# Patient Record
Sex: Female | Born: 2000 | Race: White | Hispanic: No | Marital: Single | Smoking: Former smoker
Health system: Southern US, Community
[De-identification: ages and names within clinical notes are randomized; demographics above are authoritative.]

## PROBLEM LIST (undated history)

## (undated) ENCOUNTER — Emergency Department (HOSPITAL_COMMUNITY): Payer: Medicaid Other

## (undated) DIAGNOSIS — J302 Other seasonal allergic rhinitis: Secondary | ICD-10-CM

## (undated) DIAGNOSIS — H6690 Otitis media, unspecified, unspecified ear: Secondary | ICD-10-CM

## (undated) DIAGNOSIS — Z789 Other specified health status: Secondary | ICD-10-CM

## (undated) DIAGNOSIS — R519 Headache, unspecified: Secondary | ICD-10-CM

## (undated) DIAGNOSIS — Z8719 Personal history of other diseases of the digestive system: Secondary | ICD-10-CM

## (undated) DIAGNOSIS — O26619 Liver and biliary tract disorders in pregnancy, unspecified trimester: Secondary | ICD-10-CM

## (undated) DIAGNOSIS — F419 Anxiety disorder, unspecified: Secondary | ICD-10-CM

## (undated) DIAGNOSIS — K831 Obstruction of bile duct: Secondary | ICD-10-CM

## (undated) HISTORY — DX: Otitis media, unspecified, unspecified ear: H66.90

## (undated) HISTORY — DX: Personal history of other diseases of the digestive system: Z87.19

## (undated) HISTORY — PX: TONSILLECTOMY: SUR1361

## (undated) HISTORY — DX: Obstruction of bile duct: O26.619

## (undated) HISTORY — DX: Obstruction of bile duct: K83.1

---
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
## 2019-02-08 NOTE — L&D Delivery Note (Signed)
Delivery Note Pt progressed quickly to complete and pushed well.  At 3:37 PM a viable female was delivered via Vaginal, Spontaneous (Presentation: Left Occiput Anterior).  APGAR: 7, 8; weight  .   Placenta status: Spontaneous, Intact.  Cord: 3 vessels with the following complications: None.    Anesthesia: Epidural Episiotomy: Median Lacerations: None Suture Repair: 2.0 vicryl rapide Est. Blood Loss (mL):  210  Mom to postpartum.  Baby to Couplet care / Skin to Skin. Will continue magnesium for at least 24 hrs, recheck labs in am Zenaida Niece 04/26/2019, 4:01 PM

## 2019-04-08 DIAGNOSIS — Z8759 Personal history of other complications of pregnancy, childbirth and the puerperium: Secondary | ICD-10-CM

## 2019-04-08 HISTORY — DX: Personal history of other complications of pregnancy, childbirth and the puerperium: Z87.59

## 2019-04-22 LAB — OB RESULTS CONSOLE GBS: GBS: NEGATIVE

## 2019-04-25 ENCOUNTER — Ambulatory Visit (INDEPENDENT_AMBULATORY_CARE_PROVIDER_SITE_OTHER): Payer: Medicaid Other

## 2019-04-25 ENCOUNTER — Encounter (HOSPITAL_COMMUNITY): Payer: Self-pay | Admitting: Obstetrics and Gynecology

## 2019-04-25 ENCOUNTER — Other Ambulatory Visit: Payer: Self-pay

## 2019-04-25 ENCOUNTER — Inpatient Hospital Stay (HOSPITAL_COMMUNITY)
Admission: AD | Admit: 2019-04-25 | Discharge: 2019-04-28 | DRG: 805 | Disposition: A | Discharge status: Home / Self Care | Payer: Medicaid Other | Attending: Obstetrics and Gynecology | Admitting: Obstetrics and Gynecology

## 2019-04-25 VITALS — BP 147/87 | HR 72

## 2019-04-25 DIAGNOSIS — O1494 Unspecified pre-eclampsia, complicating childbirth: Secondary | ICD-10-CM | POA: Diagnosis present

## 2019-04-25 DIAGNOSIS — Z20822 Contact with and (suspected) exposure to covid-19: Secondary | ICD-10-CM | POA: Diagnosis present

## 2019-04-25 DIAGNOSIS — Z3A36 36 weeks gestation of pregnancy: Secondary | ICD-10-CM

## 2019-04-25 DIAGNOSIS — O133 Gestational [pregnancy-induced] hypertension without significant proteinuria, third trimester: Secondary | ICD-10-CM

## 2019-04-25 DIAGNOSIS — Z6791 Unspecified blood type, Rh negative: Secondary | ICD-10-CM | POA: Diagnosis not present

## 2019-04-25 DIAGNOSIS — R7989 Other specified abnormal findings of blood chemistry: Secondary | ICD-10-CM | POA: Diagnosis present

## 2019-04-25 DIAGNOSIS — O26613 Liver and biliary tract disorders in pregnancy, third trimester: Secondary | ICD-10-CM | POA: Diagnosis present

## 2019-04-25 DIAGNOSIS — K831 Obstruction of bile duct: Secondary | ICD-10-CM | POA: Diagnosis present

## 2019-04-25 DIAGNOSIS — O26893 Other specified pregnancy related conditions, third trimester: Secondary | ICD-10-CM | POA: Diagnosis present

## 2019-04-25 DIAGNOSIS — O2662 Liver and biliary tract disorders in childbirth: Secondary | ICD-10-CM | POA: Diagnosis present

## 2019-04-25 DIAGNOSIS — Z87891 Personal history of nicotine dependence: Secondary | ICD-10-CM | POA: Diagnosis not present

## 2019-04-25 HISTORY — DX: Other specified health status: Z78.9

## 2019-04-25 LAB — COMPREHENSIVE METABOLIC PANEL
ALT: 181 U/L — ABNORMAL HIGH (ref 0–44)
AST: 134 U/L — ABNORMAL HIGH (ref 15–41)
Albumin: 2.9 g/dL — ABNORMAL LOW (ref 3.5–5.0)
Alkaline Phosphatase: 390 U/L — ABNORMAL HIGH (ref 38–126)
Anion gap: 13 (ref 5–15)
BUN: 5 mg/dL — ABNORMAL LOW (ref 6–20)
CO2: 18 mmol/L — ABNORMAL LOW (ref 22–32)
Calcium: 8.9 mg/dL (ref 8.9–10.3)
Chloride: 107 mmol/L (ref 98–111)
Creatinine, Ser: 0.59 mg/dL (ref 0.44–1.00)
GFR calc Af Amer: >60 mL/min (ref >60)
GFR calc non Af Amer: >60 mL/min (ref >60)
Glucose, Bld: 105 mg/dL — ABNORMAL HIGH (ref 70–99)
Potassium: 3.9 mmol/L (ref 3.5–5.1)
Sodium: 138 mmol/L (ref 135–145)
Total Bilirubin: 0.8 mg/dL (ref 0.3–1.2)
Total Protein: 6 g/dL — ABNORMAL LOW (ref 6.5–8.1)

## 2019-04-25 LAB — CBC
HCT: 38.8 % (ref 36.0–46.0)
Hemoglobin: 12.6 g/dL (ref 12.0–15.0)
MCH: 28.1 pg (ref 26.0–34.0)
MCHC: 32.5 g/dL (ref 30.0–36.0)
MCV: 86.6 fL (ref 80.0–100.0)
Platelets: 445 10*3/uL — ABNORMAL HIGH (ref 150–400)
RBC: 4.48 MIL/uL (ref 3.87–5.11)
RDW: 13.5 % (ref 11.5–15.5)
WBC: 14.6 10*3/uL — ABNORMAL HIGH (ref 4.0–10.5)
nRBC: 0 % (ref 0.0–0.2)

## 2019-04-25 MED ORDER — HYDRALAZINE HCL 10 MG PO TABS
10.0000 mg | ORAL_TABLET | Freq: Four times a day (QID) | ORAL | Status: DC
  Filled 2019-04-25 (×2): qty 1

## 2019-04-25 MED ORDER — OXYTOCIN 40 UNITS IN NORMAL SALINE INFUSION - SIMPLE MED
2.5000 [IU]/h | INTRAVENOUS | Status: DC
  Administered 2019-04-26: 2.5 [IU]/h via INTRAVENOUS

## 2019-04-25 MED ORDER — ACETAMINOPHEN 325 MG PO TABS: 650.0000 mg | ORAL_TABLET | ORAL | Status: DC | PRN

## 2019-04-25 MED ORDER — MISOPROSTOL 50MCG HALF TABLET
50.0000 ug | ORAL_TABLET | Freq: Three times a day (TID) | ORAL | Status: DC | PRN
  Administered 2019-04-25: 50 ug via ORAL
  Filled 2019-04-25: qty 1

## 2019-04-25 MED ORDER — ONDANSETRON HCL 4 MG/2ML IJ SOLN: 4.0000 mg | Freq: Four times a day (QID) | INTRAMUSCULAR | Status: DC | PRN

## 2019-04-25 MED ORDER — LABETALOL HCL 5 MG/ML IV SOLN: 20.0000 mg | INTRAVENOUS | Status: DC | PRN

## 2019-04-25 MED ORDER — MAGNESIUM SULFATE 40 GM/1000ML IV SOLN
1.0000 g/h | INTRAVENOUS | Status: AC
  Administered 2019-04-26: 2 g/h via INTRAVENOUS
  Filled 2019-04-25 (×2): qty 1000

## 2019-04-25 MED ORDER — BETAMETHASONE SOD PHOS & ACET 6 (3-3) MG/ML IJ SUSP
12.0000 mg | Freq: Once | INTRAMUSCULAR | Status: AC
  Administered 2019-04-25: 13:00:00 12 mg via INTRAMUSCULAR

## 2019-04-25 MED ORDER — LABETALOL HCL 5 MG/ML IV SOLN: 80.0000 mg | INTRAVENOUS | Status: DC | PRN

## 2019-04-25 MED ORDER — TERBUTALINE SULFATE 1 MG/ML IJ SOLN: 0.2500 mg | Freq: Once | INTRAMUSCULAR | Status: DC | PRN

## 2019-04-25 MED ORDER — LACTATED RINGERS IV SOLN: 500.0000 mL | INTRAVENOUS | Status: DC | PRN

## 2019-04-25 MED ORDER — SOD CITRATE-CITRIC ACID 500-334 MG/5ML PO SOLN: 30.0000 mL | ORAL | Status: DC | PRN

## 2019-04-25 MED ORDER — LIDOCAINE HCL (PF) 1 % IJ SOLN: 30.0000 mL | INTRAMUSCULAR | Status: DC | PRN

## 2019-04-25 MED ORDER — OXYTOCIN BOLUS FROM INFUSION
500.0000 mL | Freq: Once | INTRAVENOUS | Status: AC
  Administered 2019-04-26: 500 mL via INTRAVENOUS

## 2019-04-25 MED ORDER — BETAMETHASONE SOD PHOS & ACET 6 (3-3) MG/ML IJ SUSP
12.0000 mg | Freq: Once | INTRAMUSCULAR | Status: AC
  Administered 2019-04-26: 12 mg via INTRAMUSCULAR
  Filled 2019-04-25: qty 5

## 2019-04-25 MED ORDER — OXYTOCIN 40 UNITS IN NORMAL SALINE INFUSION - SIMPLE MED: 1.0000 m[IU]/min | INTRAVENOUS | Status: DC

## 2019-04-25 MED ORDER — BUTORPHANOL TARTRATE 1 MG/ML IJ SOLN
2.0000 mg | INTRAMUSCULAR | Status: DC | PRN
  Administered 2019-04-26: 12:00:00 2 mg via INTRAVENOUS
  Filled 2019-04-25: qty 2

## 2019-04-25 MED ORDER — LABETALOL HCL 5 MG/ML IV SOLN: 40.0000 mg | INTRAVENOUS | Status: DC | PRN

## 2019-04-25 MED ORDER — LACTATED RINGERS IV SOLN: INTRAVENOUS | Status: DC

## 2019-04-25 MED ORDER — OXYCODONE-ACETAMINOPHEN 5-325 MG PO TABS: 1.0000 | ORAL_TABLET | ORAL | Status: DC | PRN

## 2019-04-25 MED ORDER — MAGNESIUM SULFATE BOLUS VIA INFUSION
4.0000 g | Freq: Once | INTRAVENOUS | Status: AC
  Administered 2019-04-25: 23:00:00 4 g via INTRAVENOUS
  Filled 2019-04-25: qty 1000

## 2019-04-25 MED ORDER — MISOPROSTOL 25 MCG QUARTER TABLET
25.0000 ug | ORAL_TABLET | Freq: Three times a day (TID) | ORAL | Status: DC | PRN
  Administered 2019-04-26: 03:00:00 25 ug via VAGINAL
  Filled 2019-04-25: qty 1

## 2019-04-25 MED ORDER — OXYCODONE-ACETAMINOPHEN 5-325 MG PO TABS: 2.0000 | ORAL_TABLET | ORAL | Status: DC | PRN

## 2019-04-25 NOTE — Progress Notes (Signed)
Chelsea [name] here for Betamethasone  Injection.  Injection administered without complication. Patient will return in 24 hours for next injection.  Ralene Bathe, RN 04/25/2019  12:29 PM

## 2019-04-25 NOTE — H&P (Addendum)
Chelsea [name] is a 19 y.o. female G1P0 at 54+ with cholestasis, PIH - elevated Liver enzymes elevated on lab evaluation.  Bile Acids = 40.  Also Rh negative received Rhogam 1/18.  BMZ given 3/18  - 12:35pm, repeat at 00:45am.  D/W pt elevated Liver Enzymes and need for delivery.  D/W pt r/b/a of IOL and process.  GBBS neg 3/15 AST 40, ALT 98; 3/18 AST 80, ALT 127; platelets 423.  D/W pt IOL - PIH and Magensium for sz prophylaxis  OB History    Gravida  1   Para      Term      Preterm      AB      Living        SAB      TAB      Ectopic      Multiple      Live Births            G1 present No pap No STD  Past Medical History:  Diagnosis Date  . Medical history non-contributory   . Otitis media   . Seasonal allergies   Anxiety   No pertinent surgical history.  Family History: family history includes ADD / ADHD in her sister; Anxiety disorder in her mother and sister; Bipolar disorder in her sister; Cancer in her mother; Depression in her maternal grandfather, maternal grandmother, and sister; Diabetes in her maternal grandmother and mother; Hypertension in her maternal grandfather, maternal grandmother, and mother; Migraines in her maternal grandfather, maternal grandmother, mother, and sister; Seizures in her mother and sister. Social History:  reports that she quit smoking about 7 months ago. Her smoking use included cigarettes. She has quit using smokeless tobacco. She reports that she does not drink alcohol or use drugs.Uses CBD.  Single, engaged  All Latex Meds hydroxyzine, PNV, ursodiol, Rhogam, Effexor     Maternal Diabetes: No Genetic Screening: Declined Maternal Ultrasounds/Referrals: Normal Fetal Ultrasounds or other Referrals:  None Maternal Substance Abuse:  No Significant Maternal Medications:  Meds include: Other: CBD Significant Maternal Lab Results:  Group B Strep negative and Rh negative Other Comments:  cholestasis; HELLP  Review of Systems   Constitutional: Negative.   HENT: Negative.   Eyes: Negative.   Respiratory: Negative.   Cardiovascular: Negative.   Gastrointestinal: Negative.   Genitourinary: Negative.   Musculoskeletal: Negative.   Skin: Negative.   Neurological: Negative.   Psychiatric/Behavioral: Negative.    Maternal Medical History:  Fetal activity: Perceived fetal activity is normal.    Prenatal complications: Pre-eclampsia.   cholestasis  Prenatal Complications - Diabetes: none.      Height 5\' 2"  (1.575 m), weight 60.7 kg. Maternal Exam:  Uterine Assessment: Contraction frequency is irregular.   Abdomen: Patient reports no abdominal tenderness. Fundal height is appropriate for gestation.   Estimated fetal weight is 6#.   Fetal presentation: vertex  Introitus: Normal vulva. Normal vagina.    Physical Exam  Constitutional: She is oriented to person, place, and time. She appears well-developed and well-nourished.  HENT:  Head: Normocephalic and atraumatic.  Cardiovascular: Normal rate and regular rhythm.  Respiratory: Effort normal and breath sounds normal. No respiratory distress. She has no wheezes.  GI: Soft. Bowel sounds are normal. She exhibits no distension. There is no abdominal tenderness.  Genitourinary:    Vulva normal.   Musculoskeletal:        General: Normal range of motion.  Neurological: She is alert and oriented to person, place, and  time.  Skin: Skin is warm and dry.  Psychiatric: She has a normal mood and affect. Her behavior is normal.    Prenatal labs: ABO, Rh:  AB neg Antibody:  neg Rubella:  immune RPR:   NR HBsAg:   neg HIV:   neg GBS:   neg  Hgb 15.1/Plt 289/Ur Cx neg/GC neg/Chl neg/ Varicella immune/Hgb electro WNL/glucola 137 - nl 3hr GTT/ Nl anat, female Nl NT  PIH - elevated LFTs  Assessment/Plan: 18yo G1P0 at 36+ with cholestasis and ?HELLP syndrome Second BMZ after MN IOL w cytotec then AROM, pitocin Reevaluate labs  Magnesium for sz  prophylaxis NICU aware  Chelsea [name] 04/25/2019, 10:28 PM

## 2019-04-25 NOTE — Progress Notes (Signed)
Agree with A & P. 

## 2019-04-26 ENCOUNTER — Other Ambulatory Visit: Payer: Self-pay

## 2019-04-26 ENCOUNTER — Ambulatory Visit: Payer: Medicaid Other

## 2019-04-26 ENCOUNTER — Inpatient Hospital Stay (HOSPITAL_COMMUNITY): Payer: Medicaid Other | Admitting: Anesthesiology

## 2019-04-26 ENCOUNTER — Encounter (HOSPITAL_COMMUNITY): Payer: Self-pay | Admitting: Obstetrics and Gynecology

## 2019-04-26 DIAGNOSIS — O26643 Intrahepatic cholestasis of pregnancy, third trimester: Secondary | ICD-10-CM | POA: Diagnosis present

## 2019-04-26 DIAGNOSIS — O26613 Liver and biliary tract disorders in pregnancy, third trimester: Secondary | ICD-10-CM | POA: Diagnosis present

## 2019-04-26 DIAGNOSIS — K831 Obstruction of bile duct: Secondary | ICD-10-CM | POA: Diagnosis present

## 2019-04-26 LAB — COMPREHENSIVE METABOLIC PANEL
ALT: 252 U/L — ABNORMAL HIGH (ref 0–44)
AST: 194 U/L — ABNORMAL HIGH (ref 15–41)
Albumin: 2.9 g/dL — ABNORMAL LOW (ref 3.5–5.0)
Alkaline Phosphatase: 380 U/L — ABNORMAL HIGH (ref 38–126)
Anion gap: 14 (ref 5–15)
BUN: <5 mg/dL — ABNORMAL LOW (ref 6–20)
CO2: 16 mmol/L — ABNORMAL LOW (ref 22–32)
Calcium: 7.6 mg/dL — ABNORMAL LOW (ref 8.9–10.3)
Chloride: 105 mmol/L (ref 98–111)
Creatinine, Ser: 0.6 mg/dL (ref 0.44–1.00)
GFR calc Af Amer: >60 mL/min (ref >60)
GFR calc non Af Amer: >60 mL/min (ref >60)
Glucose, Bld: 118 mg/dL — ABNORMAL HIGH (ref 70–99)
Potassium: 4 mmol/L (ref 3.5–5.1)
Sodium: 135 mmol/L (ref 135–145)
Total Bilirubin: 0.8 mg/dL (ref 0.3–1.2)
Total Protein: 6.2 g/dL — ABNORMAL LOW (ref 6.5–8.1)

## 2019-04-26 LAB — CBC
HCT: 39.4 % (ref 36.0–46.0)
HCT: 35.6 % — ABNORMAL LOW (ref 36.0–46.0)
Hemoglobin: 12.8 g/dL (ref 12.0–15.0)
Hemoglobin: 11.6 g/dL — ABNORMAL LOW (ref 12.0–15.0)
MCH: 28.6 pg (ref 26.0–34.0)
MCH: 28.5 pg (ref 26.0–34.0)
MCHC: 32.5 g/dL (ref 30.0–36.0)
MCHC: 32.6 g/dL (ref 30.0–36.0)
MCV: 88.1 fL (ref 80.0–100.0)
MCV: 87.5 fL (ref 80.0–100.0)
Platelets: 454 10*3/uL — ABNORMAL HIGH (ref 150–400)
Platelets: 417 10*3/uL — ABNORMAL HIGH (ref 150–400)
RBC: 4.47 MIL/uL (ref 3.87–5.11)
RBC: 4.07 MIL/uL (ref 3.87–5.11)
RDW: 13.7 % (ref 11.5–15.5)
RDW: 13.6 % (ref 11.5–15.5)
WBC: 15.2 10*3/uL — ABNORMAL HIGH (ref 4.0–10.5)
WBC: 18.7 10*3/uL — ABNORMAL HIGH (ref 4.0–10.5)
nRBC: 0 % (ref 0.0–0.2)
nRBC: 0 % (ref 0.0–0.2)

## 2019-04-26 LAB — RPR: RPR Ser Ql: NONREACTIVE

## 2019-04-26 LAB — TYPE AND SCREEN
ABO/RH(D): AB NEG
Antibody Screen: POSITIVE

## 2019-04-26 LAB — PROTEIN / CREATININE RATIO, URINE
Creatinine, Urine: 78.1 mg/dL
Protein Creatinine Ratio: 0.35 mg/mg{Cre} — ABNORMAL HIGH (ref 0.00–0.15)
Total Protein, Urine: 27 mg/dL

## 2019-04-26 LAB — SARS CORONAVIRUS 2 (TAT 6-24 HRS): SARS Coronavirus 2: NEGATIVE

## 2019-04-26 MED ORDER — EPHEDRINE 5 MG/ML INJ: 10.0000 mg | INTRAVENOUS | Status: DC | PRN

## 2019-04-26 MED ORDER — DIPHENHYDRAMINE HCL 50 MG/ML IJ SOLN: 12.5000 mg | INTRAMUSCULAR | Status: DC | PRN

## 2019-04-26 MED ORDER — PHENYLEPHRINE 40 MCG/ML (10ML) SYRINGE FOR IV PUSH (FOR BLOOD PRESSURE SUPPORT): 80.0000 ug | PREFILLED_SYRINGE | INTRAVENOUS | Status: DC | PRN

## 2019-04-26 MED ORDER — LACTATED RINGERS IV SOLN: INTRAVENOUS | Status: DC

## 2019-04-26 MED ORDER — FENTANYL-BUPIVACAINE-NACL 0.5-0.125-0.9 MG/250ML-% EP SOLN
12.0000 mL/h | EPIDURAL | Status: DC | PRN
  Filled 2019-04-26: qty 250

## 2019-04-26 MED ORDER — ONDANSETRON HCL 4 MG PO TABS: 4.0000 mg | ORAL_TABLET | ORAL | Status: DC | PRN

## 2019-04-26 MED ORDER — BENZOCAINE-MENTHOL 20-0.5 % EX AERO
1.0000 "application " | INHALATION_SPRAY | CUTANEOUS | Status: DC | PRN
  Administered 2019-04-26: 1 via TOPICAL
  Filled 2019-04-26: qty 56

## 2019-04-26 MED ORDER — METHYLERGONOVINE MALEATE 0.2 MG PO TABS: 0.2000 mg | ORAL_TABLET | ORAL | Status: DC | PRN

## 2019-04-26 MED ORDER — ONDANSETRON HCL 4 MG/2ML IJ SOLN
4.0000 mg | INTRAMUSCULAR | Status: DC | PRN
  Administered 2019-04-26 – 2019-04-27 (×2): 4 mg via INTRAVENOUS
  Filled 2019-04-26 (×2): qty 2

## 2019-04-26 MED ORDER — LACTATED RINGERS IV SOLN
500.0000 mL | Freq: Once | INTRAVENOUS | Status: AC
  Administered 2019-04-26: 500 mL via INTRAVENOUS

## 2019-04-26 MED ORDER — SIMETHICONE 80 MG PO CHEW: 80.0000 mg | CHEWABLE_TABLET | ORAL | Status: DC | PRN

## 2019-04-26 MED ORDER — SODIUM CHLORIDE (PF) 0.9 % IJ SOLN
INTRAMUSCULAR | Status: DC | PRN
  Administered 2019-04-26: 12 mL/h via EPIDURAL

## 2019-04-26 MED ORDER — LIDOCAINE HCL (PF) 1 % IJ SOLN
INTRAMUSCULAR | Status: DC | PRN
  Administered 2019-04-26: 5 mL via EPIDURAL
  Administered 2019-04-26: 3 mL via EPIDURAL
  Administered 2019-04-26: 2 mL via EPIDURAL

## 2019-04-26 MED ORDER — SENNOSIDES-DOCUSATE SODIUM 8.6-50 MG PO TABS
2.0000 | ORAL_TABLET | ORAL | Status: DC
  Administered 2019-04-26 – 2019-04-27 (×2): 2 via ORAL
  Filled 2019-04-26 (×2): qty 2

## 2019-04-26 MED ORDER — WITCH HAZEL-GLYCERIN EX PADS: 1.0000 "application " | MEDICATED_PAD | CUTANEOUS | Status: DC | PRN

## 2019-04-26 MED ORDER — OXYTOCIN 40 UNITS IN NORMAL SALINE INFUSION - SIMPLE MED
1.0000 m[IU]/min | INTRAVENOUS | Status: DC
  Administered 2019-04-26: 4 m[IU]/min via INTRAVENOUS
  Administered 2019-04-26: 2 m[IU]/min via INTRAVENOUS
  Filled 2019-04-26: qty 1000

## 2019-04-26 MED ORDER — ZOLPIDEM TARTRATE 5 MG PO TABS: 5.0000 mg | ORAL_TABLET | Freq: Every evening | ORAL | Status: DC | PRN

## 2019-04-26 MED ORDER — PRENATAL MULTIVITAMIN CH
1.0000 | ORAL_TABLET | Freq: Every day | ORAL | Status: DC
  Administered 2019-04-27 – 2019-04-28 (×2): 1 via ORAL
  Filled 2019-04-26 (×2): qty 1

## 2019-04-26 MED ORDER — MAGNESIUM HYDROXIDE 400 MG/5ML PO SUSP: 30.0000 mL | ORAL | Status: DC | PRN

## 2019-04-26 MED ORDER — ACETAMINOPHEN 325 MG PO TABS
650.0000 mg | ORAL_TABLET | ORAL | Status: DC | PRN
  Filled 2019-04-26: qty 2

## 2019-04-26 MED ORDER — MEASLES, MUMPS & RUBELLA VAC IJ SOLR: 0.5000 mL | Freq: Once | INTRAMUSCULAR | Status: DC

## 2019-04-26 MED ORDER — OXYCODONE HCL 5 MG PO TABS: 10.0000 mg | ORAL_TABLET | ORAL | Status: DC | PRN

## 2019-04-26 MED ORDER — DIPHENHYDRAMINE HCL 25 MG PO CAPS: 25.0000 mg | ORAL_CAPSULE | Freq: Four times a day (QID) | ORAL | Status: DC | PRN

## 2019-04-26 MED ORDER — COCONUT OIL OIL
1.0000 "application " | TOPICAL_OIL | Status: DC | PRN
  Administered 2019-04-26: 1 via TOPICAL

## 2019-04-26 MED ORDER — IBUPROFEN 600 MG PO TABS
600.0000 mg | ORAL_TABLET | Freq: Four times a day (QID) | ORAL | Status: DC
  Administered 2019-04-26 – 2019-04-28 (×8): 600 mg via ORAL
  Filled 2019-04-26 (×8): qty 1

## 2019-04-26 MED ORDER — TETANUS-DIPHTH-ACELL PERTUSSIS 5-2.5-18.5 LF-MCG/0.5 IM SUSP: 0.5000 mL | Freq: Once | INTRAMUSCULAR | Status: DC

## 2019-04-26 MED ORDER — METHYLERGONOVINE MALEATE 0.2 MG/ML IJ SOLN: 0.2000 mg | INTRAMUSCULAR | Status: DC | PRN

## 2019-04-26 MED ORDER — OXYCODONE HCL 5 MG PO TABS
5.0000 mg | ORAL_TABLET | ORAL | Status: DC | PRN
  Administered 2019-04-26 – 2019-04-27 (×2): 5 mg via ORAL
  Filled 2019-04-26 (×2): qty 1

## 2019-04-26 MED ORDER — DIBUCAINE (PERIANAL) 1 % EX OINT: 1.0000 "application " | TOPICAL_OINTMENT | CUTANEOUS | Status: DC | PRN

## 2019-04-26 NOTE — Anesthesia Procedure Notes (Signed)
Epidural Patient location during procedure: OB Start time: 04/26/2019 12:14 PM End time: 04/26/2019 12:22 PM  Staffing Anesthesiologist: Cecile Hearing, MD Performed: anesthesiologist   Preanesthetic Checklist Completed: patient identified, IV checked, risks and benefits discussed, monitors and equipment checked, pre-op evaluation and timeout performed  Epidural Patient position: sitting Prep: DuraPrep Patient monitoring: blood pressure and continuous pulse ox Approach: midline Location: L3-L4 Injection technique: LOR air  Needle:  Needle type: Tuohy  Needle gauge: 17 G Needle length: 9 cm Needle insertion depth: 5 cm Catheter size: 19 Gauge Catheter at skin depth: 10 cm Test dose: negative and Other (1% Lidocaine)  Additional Notes Patient identified.  Risk benefits discussed including failed block, incomplete pain control, headache, nerve damage, paralysis, blood pressure changes, nausea, vomiting, reactions to medication both toxic or allergic, and postpartum back pain.  Patient expressed understanding and wished to proceed.  All questions were answered.  Sterile technique used throughout procedure and epidural site dressed with sterile barrier dressing. No paresthesia or other complications noted. The patient did not experience any signs of intravascular injection such as tinnitus or metallic taste in mouth nor signs of intrathecal spread such as rapid motor block. Please see nursing notes for vital signs. Reason for block:procedure for pain

## 2019-04-26 NOTE — Anesthesia Preprocedure Evaluation (Signed)
Anesthesia Evaluation  Patient identified by MRN, date of birth, ID band Patient awake    Reviewed: Allergy & Precautions, NPO status , Patient's Chart, lab work & pertinent test results  Airway Mallampati: II  TM Distance: >3 FB Neck ROM: Full    Dental  (+) Teeth Intact, Dental Advisory Given   Pulmonary former smoker,    Pulmonary exam normal breath sounds clear to auscultation       Cardiovascular negative cardio ROS Normal cardiovascular exam Rhythm:Regular Rate:Normal     Neuro/Psych  Headaches, PSYCHIATRIC DISORDERS Anxiety    GI/Hepatic negative GI ROS, Elevated LFTs   Endo/Other  negative endocrine ROS  Renal/GU negative Renal ROS     Musculoskeletal negative musculoskeletal ROS (+)   Abdominal   Peds  Hematology negative hematology ROS (+) Plt 454k    Anesthesia Other Findings Day of surgery medications reviewed with the patient.  Reproductive/Obstetrics (+) Pregnancy Pre-eclampsia                              Anesthesia Physical Anesthesia Plan  ASA: III  Anesthesia Plan: Epidural   Post-op Pain Management:    Induction:   PONV Risk Score and Plan: 2 and Treatment may vary due to age or medical condition  Airway Management Planned: Natural Airway  Additional Equipment:   Intra-op Plan:   Post-operative Plan:   Informed Consent: I have reviewed the patients History and Physical, chart, labs and discussed the procedure including the risks, benefits and alternatives for the proposed anesthesia with the patient or authorized representative who has indicated his/her understanding and acceptance.     Dental advisory given  Plan Discussed with:   Anesthesia Plan Comments: (Patient identified. Risks/Benefits/Options discussed with patient including but not limited to bleeding, infection, nerve damage, paralysis, failed block, incomplete pain control, headache,  blood pressure changes, nausea, vomiting, reactions to medication both or allergic, itching and postpartum back pain. Confirmed with bedside nurse the patient's most recent platelet count. Confirmed with patient that they are not currently taking any anticoagulation, have any bleeding history or any family history of bleeding disorders. Patient expressed understanding and wished to proceed. All questions were answered. )        Anesthesia Quick Evaluation

## 2019-04-26 NOTE — Progress Notes (Signed)
Comfortable with epidural Afeb, VSS, BP almost all normal but had one 148/91 FHT-120-130, mod variability, some late decels, + scalp stim, ctx q 2-4 min VE-7-8/90/0, AROM no fluid  LFTs up a bit more  Will monitor progress and FHT closely

## 2019-04-26 NOTE — Progress Notes (Signed)
Feeling some ctx Afeb, VSS, BP normal FHT- 120-130, mod variability, + accels, some ? Early, variable and ? Late decels, Cat II, irreg ctx VE-1/50/-2, vtx  IUP at 36 + weeks with cholestasis, elevated LFTs, UPC 0.35.  Has had a couple BPs 140/100 on admission, but BP currently normal.  On magnesium.  Has received BMZ x 2, has received cytotec x 2.  Will start pitocin, monitor progress and FHT, recheck CMP

## 2019-04-27 ENCOUNTER — Encounter (HOSPITAL_COMMUNITY): Payer: Self-pay | Admitting: Obstetrics and Gynecology

## 2019-04-27 LAB — COMPREHENSIVE METABOLIC PANEL
ALT: 258 U/L — ABNORMAL HIGH (ref 0–44)
AST: 162 U/L — ABNORMAL HIGH (ref 15–41)
Albumin: 2.3 g/dL — ABNORMAL LOW (ref 3.5–5.0)
Alkaline Phosphatase: 311 U/L — ABNORMAL HIGH (ref 38–126)
Anion gap: 11 (ref 5–15)
BUN: <5 mg/dL — ABNORMAL LOW (ref 6–20)
CO2: 24 mmol/L (ref 22–32)
Calcium: 6.7 mg/dL — ABNORMAL LOW (ref 8.9–10.3)
Chloride: 102 mmol/L (ref 98–111)
Creatinine, Ser: 0.68 mg/dL (ref 0.44–1.00)
GFR calc Af Amer: >60 mL/min (ref >60)
GFR calc non Af Amer: >60 mL/min (ref >60)
Glucose, Bld: 112 mg/dL — ABNORMAL HIGH (ref 70–99)
Potassium: 3.9 mmol/L (ref 3.5–5.1)
Sodium: 137 mmol/L (ref 135–145)
Total Bilirubin: 0.5 mg/dL (ref 0.3–1.2)
Total Protein: 5 g/dL — ABNORMAL LOW (ref 6.5–8.1)

## 2019-04-27 LAB — CBC
HCT: 34.9 % — ABNORMAL LOW (ref 36.0–46.0)
Hemoglobin: 11.3 g/dL — ABNORMAL LOW (ref 12.0–15.0)
MCH: 28.7 pg (ref 26.0–34.0)
MCHC: 32.4 g/dL (ref 30.0–36.0)
MCV: 88.6 fL (ref 80.0–100.0)
Platelets: 436 10*3/uL — ABNORMAL HIGH (ref 150–400)
RBC: 3.94 MIL/uL (ref 3.87–5.11)
RDW: 13.8 % (ref 11.5–15.5)
WBC: 16.8 10*3/uL — ABNORMAL HIGH (ref 4.0–10.5)
nRBC: 0 % (ref 0.0–0.2)

## 2019-04-27 MED ORDER — RHO D IMMUNE GLOBULIN 1500 UNIT/2ML IJ SOSY
300.0000 ug | PREFILLED_SYRINGE | Freq: Once | INTRAMUSCULAR | Status: AC
  Administered 2019-04-27: 300 ug via INTRAVENOUS
  Filled 2019-04-27: qty 2

## 2019-04-27 NOTE — Plan of Care (Signed)
  Problem: Education: Goal: Knowledge of condition will improve Outcome: Progressing   Problem: Education: Goal: Knowledge of disease or condition will improve Outcome: Progressing

## 2019-04-27 NOTE — Anesthesia Postprocedure Evaluation (Signed)
Anesthesia Post Note  Patient: Chelsea [name]  Procedure(s) Performed: AN AD HOC LABOR EPIDURAL     Patient location during evaluation: Mother Baby Anesthesia Type: Epidural Level of consciousness: awake and alert, oriented and patient cooperative Pain management: pain level controlled Vital Signs Assessment: post-procedure vital signs reviewed and stable Respiratory status: spontaneous breathing Cardiovascular status: stable Postop Assessment: no headache, epidural receding, patient able to bend at knees and no signs of nausea or vomiting Anesthetic complications: no Comments: Pt. States she is walking.  Pain score 4 and patient states pain is manageable.     Last Vitals:  Vitals:   04/27/19 0000 04/27/19 0545  BP:  (!) 128/95  Pulse:  82  Resp: 18 18  Temp:  36.5 C  SpO2:  100%    Last Pain:  Vitals:   04/27/19 0600  TempSrc:   PainSc: 3    Pain Goal: Patients Stated Pain Goal: 3 (04/27/19 0600)                 Merrilyn Puma

## 2019-04-27 NOTE — Lactation Note (Signed)
This note was copied from a Chelsea's chart. Lactation Consultation Note  Patient Name: Chelsea [name] Today's Date: 04/27/2019  Chelsea [name] now about 54 hours old and mom reports she has attempted today but not latched.  Mom intitiated pumping 1 time with DEBP.  Mom with history of anxiety and depression.   Mom has small, short shaft nipples that evert well with stimulation.  Mom reports she wants to breastfeed.  Urged importance of massage/hand expression/pumping with 36 week infant.Mom reports infant recently fed formula.  Discussed use of donor milk now that mom has initiated pumping.  Parents report they will think on it. Urged parents to offer the breast based on cues and 8 times day or more if no cues.  Parents received LPTi infant green sheet.  Reviewed supplementation amounts.  Parents request come back to the next feeding.    Maternal Data    Feeding Feeding Type: Bottle Fed - Formula Nipple Type: Slow - flow  LATCH Score                   Interventions    Lactation Tools Discussed/Used     Consult Status      Chelsea [name] Michaelle Copas 04/27/2019, 6:53 PM

## 2019-04-27 NOTE — Lactation Note (Signed)
This note was copied from a baby's chart. Lactation Consultation Note  Patient Name: [name] [id number] Date: 04/27/2019   Parents request to assist with feeding at 3 pm .  Discussed with mom possibilty of using nipple shield.  Mom agrees. Show mom how to hand express and get milk started before applying the shield.  Mom able to demo application of nipple shield back. Used 63mm nipple shield with mom. In cross cradle hold on left breast Infant opened and latched well on left breast and fair to good breastfeeding observed for 20 minutes. Infant let go and appeared satiated and breastmilk in nipple shield.  Mom moved her skin to skin and she started rooting.  Parents able to demo applying shield on right breast and latching her in cross cradle hold.  Did not have the dome all the way in mouth.  Reviewed importance of making sure cheeks and chin touch the breast and all the way on the dome. Left mom and baby breastfeeding with dad observing.  Urged to hand express and pump past breastfeedings and feed back all expressed mothers milk and formula as needed to make recommended amount and more if infant wants more. Urged to call lactation as needed.   Maternal Data    Feeding Feeding Type: Bottle Fed - Formula Nipple Type: Slow - flow  LATCH Score                   Interventions    Lactation Tools Discussed/Used     Consult Status      Shimika Ames S Sophie Quiles 04/27/2019, 7:00 PM

## 2019-04-27 NOTE — Progress Notes (Signed)
PPD #1, cholestasis, elevated LFTs, ? Preeclampsia Not feeling very well, nauseated, feels a bit weak Afeb, VSS, BP normal Fundus firm DTR 2/4 Labs essentially unchanged  Will continue current care.  I think she is feeling bad being on magnesium, but I do not think she is magnesium toxic as has normal DTR.  However, she is a small person, will reduce magnesium to 1 gm/hr and stop at 1600.  Will recheck CMP tomorrow

## 2019-04-27 NOTE — Lactation Note (Signed)
This note was copied from a baby's chart. Lactation Consultation Note Baby 10 hrs old.  Mom states baby isn't interested in BF yet. Mom has only been formula/bottle feeding baby. Mom states she will try to BF tomorrow/this morning. Asked mom if she has pumped mom stated no, she will wait until tomorrow/in the morning. DEBP at bedside set up. Mom stated she had given the baby a pacifier. Discouraged for 2 weeks. Explained why. Mom stated in the morning I'll take her pacifier in the morning. LC isn't sure of mom's commitment of BF. LPI information sheet given and reviewed. Explained if mom isn't going to BF then she needs to give baby more than the supplemental; amount. If mom decides not to BF then she will be given information on formula feeding amounts. Mom demonstrated hand expression w/colostrum noted. Mom happily surprised. Mom has small nipples, semi flat very short shaft. 21 flanges given for pump. LPI behavior, STS, importance of I&O, breast massage, milk storage, supply and demand discussed. Encouraged mom to call for assistance or questions. lactation brochure given.  Patient Name: Chelsea [name] [id number] Date: 04/27/2019 Reason for consult: Initial assessment;Primapara;Infant < 6lbs;Late-preterm 34-36.6wks   Maternal Data Has patient been taught Hand Expression?: Yes Does the patient have breastfeeding experience prior to this delivery?: No  Feeding Feeding Type: Bottle Fed - Formula  LATCH Score       Type of Nipple: Everted at rest and after stimulation(semi flat, small)  Comfort (Breast/Nipple): Soft / non-tender        Interventions Interventions: Breast feeding basics reviewed;Breast massage;Hand express;Breast compression  Lactation Tools Discussed/Used Tools: Coconut oil;Pump;Flanges Flange Size: 21 Breast pump type: Double-Electric Breast Pump   Consult Status Consult Status: Follow-up Date: 04/27/19 Follow-up type: In-patient    Dracen Reigle,  Diamond Nickel 04/27/2019, 2:22 AM

## 2019-04-28 LAB — COMPREHENSIVE METABOLIC PANEL
ALT: 164 U/L — ABNORMAL HIGH (ref 0–44)
AST: 55 U/L — ABNORMAL HIGH (ref 15–41)
Albumin: 2.1 g/dL — ABNORMAL LOW (ref 3.5–5.0)
Alkaline Phosphatase: 240 U/L — ABNORMAL HIGH (ref 38–126)
Anion gap: 7 (ref 5–15)
BUN: <5 mg/dL — ABNORMAL LOW (ref 6–20)
CO2: 25 mmol/L (ref 22–32)
Calcium: 8.1 mg/dL — ABNORMAL LOW (ref 8.9–10.3)
Chloride: 107 mmol/L (ref 98–111)
Creatinine, Ser: 0.51 mg/dL (ref 0.44–1.00)
GFR calc Af Amer: >60 mL/min (ref >60)
GFR calc non Af Amer: >60 mL/min (ref >60)
Glucose, Bld: 89 mg/dL (ref 70–99)
Potassium: 3.6 mmol/L (ref 3.5–5.1)
Sodium: 139 mmol/L (ref 135–145)
Total Bilirubin: 0.3 mg/dL (ref 0.3–1.2)
Total Protein: 4.7 g/dL — ABNORMAL LOW (ref 6.5–8.1)

## 2019-04-28 LAB — RH IG WORKUP (INCLUDES ABO/RH)
ABO/RH(D): AB NEG
Fetal Screen: NEGATIVE
Gestational Age(Wks): 36
Unit division: 0

## 2019-04-28 MED ORDER — IBUPROFEN 600 MG PO TABS: 600.0000 mg | ORAL_TABLET | Freq: Four times a day (QID) | ORAL | 0 refills | Status: DC

## 2019-04-28 NOTE — Discharge Instructions (Signed)
As per discharge pamphlet °

## 2019-04-28 NOTE — Discharge Summary (Signed)
OB Discharge Summary     Patient Name: Chelsea [name] DOB: Jan 13, 2001 MRN: [id number]  Date of admission: 04/25/2019 Delivering MD: Chelsea [name], Chelsea [name]   Date of discharge: 04/28/2019  Admitting diagnosis: Indication for care in labor or delivery [O75.9] Intrauterine pregnancy: [redacted]w[redacted]d     Secondary diagnosis:  Active Problems:   Indication for care in labor or delivery   SVD (spontaneous vaginal delivery)   Cholestasis during pregnancy in third trimester      Discharge diagnosis: Preterm Pregnancy Delivered and cholestasis, possible preeclampsia                                  Hospital course:  Induction of Labor With Vaginal Delivery   19 y.o. yo G1P0101 at [redacted]w[redacted]d was admitted to the hospital 04/25/2019 for induction of labor.  Indication for induction: Cholestasis of pregnancy and possible preeclampsia.  Normal BP, but elevated UPC and LFTs.  She was put on magnesium during labor for possible preeclampsia.  Patient had an uncomplicated labor course as follows: Membrane Rupture Time/Date: 2:44 PM ,04/26/2019   Intrapartum Procedures: Episiotomy: Median [2]                                         Lacerations:  None [1]  Patient had delivery of a Viable infant.  Information for the patient's newborn:  Chelsea [name], Service Girl Chelsea [name] [[id number]]  Delivery Method: Vag-Spont    04/26/2019  Details of delivery can be found in separate delivery note.  Patient had a routine postpartum course. Magnesium was continued for 24 hours, BP remained normal.  LFTs stable on PPD #1, decreasing on PPD #2. Patient is discharged home 04/28/19.  Physical exam  Vitals:   04/27/19 1943 04/27/19 2344 04/28/19 0404 04/28/19 0726  BP: 120/70 116/70 101/70 127/66  Pulse: 69 64 (!) 48 (!) 43  Resp: 16 17 16 17   Temp: 99.1 F (37.3 C) 98.6 F (37 C) 98.2 F (36.8 C) 98.2 F (36.8 C)  TempSrc: Oral Oral Oral Oral  SpO2: 98% 97% 100%   Weight:      Height:       General: alert Lochia: appropriate Uterine Fundus:  firm  Labs: Lab Results  Component Value Date   WBC 16.8 (H) 04/27/2019   HGB 11.3 (L) 04/27/2019   HCT 34.9 (L) 04/27/2019   MCV 88.6 04/27/2019   PLT 436 (H) 04/27/2019   CMP Latest Ref Rng & Units 04/28/2019  Glucose 70 - 99 mg/dL 89  BUN 6 - 20 mg/dL 04/30/2019)  Creatinine <5(K - 1.00 mg/dL 2.70  Sodium 6.23 - 762 mmol/L 139  Potassium 3.5 - 5.1 mmol/L 3.6  Chloride 98 - 111 mmol/L 107  CO2 22 - 32 mmol/L 25  Calcium 8.9 - 10.3 mg/dL 8.1(L)  Total Protein 6.5 - 8.1 g/dL 4.7(L)  Total Bilirubin 0.3 - 1.2 mg/dL 0.3  Alkaline Phos 38 - 126 U/L 240(H)  AST 15 - 41 U/L 55(H)  ALT 0 - 44 U/L 164(H)    Discharge instruction: per After Visit Summary and "Baby and Me Booklet".  After visit meds:  Allergies as of 04/28/2019      Reactions   Latex Itching, Rash      Medication List    TAKE these medications   diphenhydrAMINE 25 MG tablet Commonly known as:  BENADRYL Take 25 mg by mouth every 6 (six) hours as needed for itching.   ibuprofen 600 MG tablet Commonly known as: ADVIL Take 1 tablet (600 mg total) by mouth every 6 (six) hours.   prenatal multivitamin Tabs tablet Take 1 tablet by mouth daily at 12 noon.       Diet: routine diet  Activity: Advance as tolerated. Pelvic rest for 6 weeks.   Outpatient follow up:3-4 days  Newborn Data: Live born female  Birth Weight: 5 lb 10.3 oz (2560 g) APGAR: 7, 8  Newborn Delivery   Birth date/time: 04/26/2019 15:37:00 Delivery type: Vaginal, Spontaneous      Baby Feeding: Bottle Disposition:rooming in   04/28/2019 Chelsea Duke, MD

## 2019-04-28 NOTE — Progress Notes (Signed)
PPD #2 Feel fine Afeb, VSS, BP normal Fundus firm Labs-LFTs improving Discharge home

## 2019-04-29 ENCOUNTER — Ambulatory Visit: Payer: Self-pay

## 2019-04-29 NOTE — Lactation Note (Signed)
This note was copied from a baby's chart. Lactation Consultation Note Attempted to see mom, mom sleeping. RN stated mom's milk is coming in. Mom is pumping.  Patient Name: Chelsea [name] Date: 04/29/2019     Maternal Data    Feeding    LATCH Score                   Interventions    Lactation Tools Discussed/Used     Consult Status      Charyl Dancer 04/29/2019, 3:49 AM

## 2019-04-30 LAB — SURGICAL PATHOLOGY

## 2019-06-08 HISTORY — PX: WISDOM TOOTH EXTRACTION: SHX21

## 2019-08-10 ENCOUNTER — Emergency Department (HOSPITAL_COMMUNITY): Payer: Medicaid Other

## 2019-08-10 ENCOUNTER — Encounter (HOSPITAL_COMMUNITY): Payer: Self-pay | Admitting: Emergency Medicine

## 2019-08-10 ENCOUNTER — Other Ambulatory Visit: Payer: Self-pay

## 2019-08-10 ENCOUNTER — Emergency Department (HOSPITAL_COMMUNITY)
Admission: EM | Admit: 2019-08-10 | Discharge: 2019-08-11 | Disposition: A | Discharge status: Home / Self Care | Payer: Medicaid Other | Attending: Emergency Medicine | Admitting: Emergency Medicine

## 2019-08-10 DIAGNOSIS — R1012 Left upper quadrant pain: Secondary | ICD-10-CM | POA: Diagnosis present

## 2019-08-10 DIAGNOSIS — Z5321 Procedure and treatment not carried out due to patient leaving prior to being seen by health care provider: Secondary | ICD-10-CM | POA: Insufficient documentation

## 2019-08-10 LAB — CBC WITH DIFFERENTIAL/PLATELET
Abs Immature Granulocytes: 0.01 10*3/uL (ref 0.00–0.07)
Basophils Absolute: 0 10*3/uL (ref 0.0–0.1)
Basophils Relative: 0 %
Eosinophils Absolute: 0.1 10*3/uL (ref 0.0–0.5)
Eosinophils Relative: 1 %
HCT: 39.5 % (ref 36.0–46.0)
Hemoglobin: 13.1 g/dL (ref 12.0–15.0)
Immature Granulocytes: 0 %
Lymphocytes Relative: 41 %
Lymphs Abs: 3.5 10*3/uL (ref 0.7–4.0)
MCH: 29 pg (ref 26.0–34.0)
MCHC: 33.2 g/dL (ref 30.0–36.0)
MCV: 87.4 fL (ref 80.0–100.0)
Monocytes Absolute: 0.6 10*3/uL (ref 0.1–1.0)
Monocytes Relative: 7 %
Neutro Abs: 4.3 10*3/uL (ref 1.7–7.7)
Neutrophils Relative %: 51 %
Platelets: 397 10*3/uL (ref 150–400)
RBC: 4.52 MIL/uL (ref 3.87–5.11)
RDW: 13.2 % (ref 11.5–15.5)
WBC: 8.4 10*3/uL (ref 4.0–10.5)
nRBC: 0 % (ref 0.0–0.2)

## 2019-08-10 LAB — BASIC METABOLIC PANEL
Anion gap: 9 (ref 5–15)
BUN: 6 mg/dL (ref 6–20)
CO2: 27 mmol/L (ref 22–32)
Calcium: 9.3 mg/dL (ref 8.9–10.3)
Chloride: 104 mmol/L (ref 98–111)
Creatinine, Ser: 0.71 mg/dL (ref 0.44–1.00)
GFR calc Af Amer: >60 mL/min (ref >60)
GFR calc non Af Amer: >60 mL/min (ref >60)
Glucose, Bld: 134 mg/dL — ABNORMAL HIGH (ref 70–99)
Potassium: 3.2 mmol/L — ABNORMAL LOW (ref 3.5–5.1)
Sodium: 140 mmol/L (ref 135–145)

## 2019-08-10 LAB — I-STAT BETA HCG BLOOD, ED (MC, WL, AP ONLY): I-stat hCG, quantitative: <5 m[IU]/mL (ref <5)

## 2019-08-10 NOTE — ED Triage Notes (Signed)
Pt c/o left side rib cage pain for a month getting worse today, pt denies any sob.

## 2019-08-11 NOTE — ED Notes (Signed)
Pt didn't respond to any calls for rooming called 4 times
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
## 2021-09-02 LAB — OB RESULTS CONSOLE GC/CHLAMYDIA
Chlamydia: NEGATIVE
Neisseria Gonorrhea: NEGATIVE

## 2021-09-02 LAB — OB RESULTS CONSOLE HIV ANTIBODY (ROUTINE TESTING): HIV: NONREACTIVE

## 2021-09-02 LAB — OB RESULTS CONSOLE RPR: RPR: NONREACTIVE

## 2021-09-02 LAB — OB RESULTS CONSOLE ABO/RH: RH Type: NEGATIVE

## 2021-09-02 LAB — OB RESULTS CONSOLE ANTIBODY SCREEN: Antibody Screen: NEGATIVE

## 2021-09-02 LAB — OB RESULTS CONSOLE RUBELLA ANTIBODY, IGM: Rubella: IMMUNE

## 2021-09-02 LAB — OB RESULTS CONSOLE HEPATITIS B SURFACE ANTIGEN: Hepatitis B Surface Ag: NEGATIVE

## 2021-09-02 LAB — HEPATITIS C ANTIBODY: HCV Ab: NEGATIVE

## 2021-10-25 ENCOUNTER — Ambulatory Visit
Admission: EM | Admit: 2021-10-25 | Discharge: 2021-10-25 | Disposition: A | Discharge status: Home / Self Care | Payer: Medicaid Other | Attending: Internal Medicine | Admitting: Internal Medicine

## 2021-10-25 DIAGNOSIS — Z20822 Contact with and (suspected) exposure to covid-19: Secondary | ICD-10-CM | POA: Diagnosis not present

## 2021-10-25 DIAGNOSIS — J069 Acute upper respiratory infection, unspecified: Secondary | ICD-10-CM | POA: Diagnosis not present

## 2021-10-25 MED ORDER — FLUTICASONE PROPIONATE 50 MCG/ACT NA SUSP: 1.0000 | Freq: Every day | NASAL | 0 refills | Status: DC

## 2021-10-25 NOTE — ED Provider Notes (Signed)
[facility]    CSN: [id number] Arrival date & time: 10/25/21  1632      History   Chief Complaint Chief Complaint  Patient presents with   Generalized Body Aches    HPI Chelsea [name] is a 21 y.o. female.   Patient presents with nasal congestion and generalized body aches that started 5 days ago.  Denies cough, chest pain, shortness of breath, sore throat, ear pain, nausea, vomiting, diarrhea, abdominal pain.  Patient reports that she is currently approximately [redacted] weeks pregnant.  She has not taken any medications to help alleviate symptoms due to this. Denies any known sick contacts or fever.     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 No pertinent surgical history.  OB History     Gravida  2   Para  1   Term      Preterm  1   AB      Living  1      SAB      IAB      Ectopic      Multiple  0   Live Births  1            Home Medications    Prior to Admission medications   Medication Sig Start Date End Date Taking? Authorizing Provider  aspirin EC 81 MG tablet Take 81 mg by mouth daily. Swallow whole.   Yes [provider]  fluticasone (FLONASE) 50 MCG/ACT nasal spray Place 1 spray into both nostrils daily for 3 days. 10/25/21 10/28/21 Yes Kemoni Quesenberry, Acie Fredrickson, FNP  diphenhydrAMINE (BENADRYL) 25  MG tablet Take 25 mg by mouth every 6 (six) hours as needed for itching.    [provider]  ibuprofen (ADVIL) 600 MG tablet Take 1 tablet (600 mg total) by mouth every 6 (six) hours. 04/28/19   Meisinger, Tawanna Cooler, MD  Prenatal Vit-Fe Fumarate-FA (PRENATAL MULTIVITAMIN) TABS tablet Take 1 tablet by mouth daily at 12 noon.    [provider]    Family History Family History  Problem Relation Age of Onset   Hypertension Mother    Cancer Mother    Diabetes Mother    Migraines Mother    Seizures Mother    Anxiety disorder Mother    Hypertension Maternal Grandmother    Diabetes Maternal Grandmother    Migraines Maternal Grandmother    Depression Maternal Grandmother    Hypertension Maternal Grandfather    Migraines Maternal Grandfather    Depression Maternal Grandfather    Migraines Sister    Seizures Sister    Anxiety disorder Sister    ADD / ADHD Sister    Depression Sister    Bipolar disorder Sister    Autism Neg Hx    Schizophrenia Neg Hx     Social History Social History   Tobacco Use   Smoking status: Former  Types: Cigarettes    Quit date: 09/18/2018    Years since quitting: 3.1   Smokeless tobacco: Former  Building services engineer Use: Former  Substance Use Topics   Alcohol use: No   Drug use: No     Allergies   Latex   Review of Systems Review of Systems Per HPI  Physical Exam Triage Vital Signs ED Triage Vitals [10/25/21 1757]  Enc Vitals Group     BP 106/73     Pulse Rate 76     Resp 16     Temp 98 F (36.7 C)     Temp Source Oral     SpO2 98 %     Weight      Height      Head Circumference      Peak Flow      Pain Score 6     Pain Loc      Pain Edu?      Excl. in GC?    No data found.  Updated Vital Signs BP 106/73 (BP Location: Left Arm)   Pulse 76   Temp 98 F (36.7 C) (Oral)   Resp 16   SpO2 98%   Breastfeeding No   Visual Acuity Right Eye Distance:   Left Eye Distance:   Bilateral Distance:    Right Eye  Near:   Left Eye Near:    Bilateral Near:     Physical Exam Constitutional:      General: She is not in acute distress.    Appearance: Normal appearance. She is not toxic-appearing or diaphoretic.  HENT:     Head: Normocephalic and atraumatic.     Right Ear: Tympanic membrane and ear canal normal.     Left Ear: Tympanic membrane and ear canal normal.     Nose: Congestion present.     Mouth/Throat:     Mouth: Mucous membranes are moist.     Pharynx: No posterior oropharyngeal erythema.  Eyes:     Extraocular Movements: Extraocular movements intact.     Conjunctiva/sclera: Conjunctivae normal.     Pupils: Pupils are equal, round, and reactive to light.  Cardiovascular:     Rate and Rhythm: Normal rate and regular rhythm.     Pulses: Normal pulses.     Heart sounds: Normal heart sounds.  Pulmonary:     Effort: Pulmonary effort is normal. No respiratory distress.     Breath sounds: Normal breath sounds. No stridor. No wheezing, rhonchi or rales.  Abdominal:     General: Abdomen is flat. Bowel sounds are normal.     Palpations: Abdomen is soft.  Musculoskeletal:        General: Normal range of motion.     Cervical back: Normal range of motion.  Skin:    General: Skin is warm and dry.  Neurological:     General: No focal deficit present.     Mental Status: She is alert and oriented to person, place, and time. Mental status is at baseline.  Psychiatric:        Mood and Affect: Mood normal.        Behavior: Behavior normal.      UC Treatments / Results  Labs (all labs ordered are listed, but only abnormal results are displayed) Labs Reviewed  SARS CORONAVIRUS 2 BY RT PCR    EKG   Radiology No results found.  Procedures Procedures (including critical care time)  Medications Ordered in UC Medications - No data  to display  Initial Impression / Assessment and Plan / UC Course  I have reviewed the triage vital signs and the nursing notes.  Pertinent labs &  imaging results that were available during my care of the patient were reviewed by me and considered in my medical decision making (see chart for details).     Patient presents with symptoms likely from a viral upper respiratory infection. Differential includes bacterial pneumonia, sinusitis, allergic rhinitis, COVID-19, flu, RSV. Do not suspect underlying cardiopulmonary process. Patient is nontoxic appearing and not in need of emergent medical intervention.  COVID test pending.  Recommended symptom control with over the counter medications that are safe with pregnancy.  Will prescribe Flonase given that this is safe with pregnancy.  Return if symptoms fail to improve in 1-2 weeks or you develop shortness of breath, chest pain, severe headache. Patient states understanding and is agreeable.  Discharged with PCP followup.  Final Clinical Impressions(s) / UC Diagnoses   Final diagnoses:  Viral upper respiratory infection     Discharge Instructions      It appears that you have a viral illness that should run its course and self resolve with symptomatic treatment.  I prescribed a nasal spray to help alleviate symptoms.  COVID test is pending.  We will call if it is positive.  Please follow-up if symptoms persist or worsen.    ED Prescriptions     Medication Sig Dispense Auth. Provider   fluticasone (FLONASE) 50 MCG/ACT nasal spray Place 1 spray into both nostrils daily for 3 days. 16 g Teodora Medici, [place]      PDMP not reviewed this encounter.   Teodora Medici, [place] 10/25/21 [phone]

## 2021-10-25 NOTE — Discharge Instructions (Signed)
It appears that you have a viral illness that should run its course and self resolve with symptomatic treatment.  I prescribed a nasal spray to help alleviate symptoms.  COVID test is pending.  We will call if it is positive.  Please follow-up if symptoms persist or worsen.

## 2021-10-25 NOTE — ED Triage Notes (Signed)
Pt c/o body aches, nasal drainage x ~ 4 days

## 2021-10-26 LAB — SARS CORONAVIRUS 2 BY RT PCR: SARS Coronavirus 2 by RT PCR: NEGATIVE

## 2021-12-28 ENCOUNTER — Inpatient Hospital Stay (HOSPITAL_COMMUNITY)
Admission: AD | Admit: 2021-12-28 | Discharge: 2021-12-28 | Disposition: A | Discharge status: Home / Self Care | Payer: Medicaid Other | Attending: Obstetrics and Gynecology | Admitting: Obstetrics and Gynecology

## 2021-12-28 ENCOUNTER — Inpatient Hospital Stay (HOSPITAL_COMMUNITY): Payer: Medicaid Other

## 2021-12-28 ENCOUNTER — Encounter (HOSPITAL_COMMUNITY): Payer: Self-pay

## 2021-12-28 DIAGNOSIS — Z3A25 25 weeks gestation of pregnancy: Secondary | ICD-10-CM | POA: Diagnosis not present

## 2021-12-28 DIAGNOSIS — Z3689 Encounter for other specified antenatal screening: Secondary | ICD-10-CM

## 2021-12-28 DIAGNOSIS — R0602 Shortness of breath: Secondary | ICD-10-CM | POA: Insufficient documentation

## 2021-12-28 DIAGNOSIS — O26892 Other specified pregnancy related conditions, second trimester: Secondary | ICD-10-CM | POA: Diagnosis present

## 2021-12-28 LAB — URINALYSIS, ROUTINE W REFLEX MICROSCOPIC
Bilirubin Urine: NEGATIVE
Glucose, UA: NEGATIVE mg/dL
Hgb urine dipstick: NEGATIVE
Ketones, ur: NEGATIVE mg/dL
Nitrite: NEGATIVE
Protein, ur: NEGATIVE mg/dL
Specific Gravity, Urine: 1.011 (ref 1.005–1.030)
pH: 6 (ref 5.0–8.0)

## 2021-12-28 MED ORDER — ALBUTEROL SULFATE HFA 108 (90 BASE) MCG/ACT IN AERS: 2.0000 | INHALATION_SPRAY | RESPIRATORY_TRACT | 0 refills | Status: DC | PRN

## 2021-12-28 NOTE — MAU Note (Addendum)
...  Chelsea [name] is a 21 y.o. at [redacted]w[redacted]d here in MAU reporting: She reports she was evaluated on the 16th at Urgent Care and reports she was told today that she has bronchitis. She reports she was prescribed Amoxicillin on the 16th of this month and reports it is 500 mg BID. She reports she was instructed today to come in for any SOB. She reports she has had SOB over the "past couple days." She reports she gets winded when she talks and with movements. She reports it feels hard to catch her breath in those moments. She is also endorsing constant fatigue. She endorses N/V with taking her medications. She reports she has only been able to keep PB&J's down. Denies VB or LOF. +FM.   Last PO Food: 1400 - PB&J Drinking on gatorade constantly.  Last PO Meds: Amoxicillin 1325 Mucinex around 1100  Onset of complaint: 16th of this month Pain score: 3/10 right mid to lower abdominal pain with coughing  FHT: 151 initial external Lab orders placed from triage:  UA

## 2021-12-28 NOTE — MAU Provider Note (Signed)
History     CSN: [id number]  Arrival date and time: 12/28/21 1604   Event Date/Time   First Provider Initiated Contact with Patient 12/28/21 1713      Chief Complaint  Patient presents with   Shortness of Breath   Bronchitis   Chelsea [name] is a 21 y.o. G2P0101 at [redacted]w[redacted]d who receives care at [facility].  Her next appt is Dec 12th. She presents today for Shortness of Breath and Bronchitis.  She reports she was seen at [facility] on 11/16 and diagnosed with bronchitis and started on Amoxicillin.  She states she is currently taking the medication and last dose was 1325.  She reports they did not do Covid testing and she did at home "a couple of days ago and today" with both being negative. She denies being around sick individuals and does not work outside the home.  She reports the SOB has been over the past "few days" and initially contributed it to pregnancy and her bronchitis.  She states she experiences the SOB with talking and with walking or activity that requires prolonged standing. She states she called her office and UC and was told to come to the MAU. Patient endorses fetal movement and denies vaginal concerns including bleeding, discharge, and leaking.     OB History     Gravida  2   Para  1   Term      Preterm  1   AB      Living  1      SAB      IAB      Ectopic      Multiple  0   Live Births  1           Past Medical History:  Diagnosis Date   Medical history non-contributory    Otitis media    Seasonal allergies     Past Surgical History:  Procedure Laterality Date   WISDOM TOOTH EXTRACTION  06/2019    Family History  Problem Relation Age of Onset   Hypertension Mother    Cancer Mother    Diabetes Mother    Migraines Mother    Seizures Mother    Anxiety disorder Mother    Hypertension Maternal Grandmother    Diabetes Maternal Grandmother    Migraines Maternal Grandmother    Depression Maternal Grandmother    Hypertension Maternal  Grandfather    Migraines Maternal Grandfather    Depression Maternal Grandfather    Migraines Sister    Seizures Sister    Anxiety disorder Sister    ADD / ADHD Sister    Depression Sister    Bipolar disorder Sister    Autism Neg Hx    Schizophrenia Neg Hx     Social History   Tobacco Use   Smoking status: Former    Types: Cigarettes    Quit date: 09/18/2018    Years since quitting: 3.2   Smokeless tobacco: Former  Building services engineer Use: Every day  Substance Use Topics   Alcohol use: No   Drug use: No    Allergies:  Allergies  Allergen Reactions   Latex Itching and Rash    Medications Prior to Admission  Medication Sig Dispense Refill Last Dose   amoxicillin (AMOXIL) 500 MG tablet Take 500 mg by mouth 2 (two) times daily.   12/28/2021   guaiFENesin (MUCINEX) 600 MG 12 hr tablet Take by mouth 2 (two) times daily.   12/28/2021 at 1100  aspirin EC 81 MG tablet Take 81 mg by mouth daily. Swallow whole.      diphenhydrAMINE (BENADRYL) 25 MG tablet Take 25 mg by mouth every 6 (six) hours as needed for itching.      fluticasone (FLONASE) 50 MCG/ACT nasal spray Place 1 spray into both nostrils daily for 3 days. 16 g 0    ibuprofen (ADVIL) 600 MG tablet Take 1 tablet (600 mg total) by mouth every 6 (six) hours. 30 tablet 0    Prenatal Vit-Fe Fumarate-FA (PRENATAL MULTIVITAMIN) TABS tablet Take 1 tablet by mouth daily at 12 noon.       Review of Systems  Constitutional:  Negative for chills and fever.  HENT:  Positive for congestion, sneezing and sore throat (Irritated).   Respiratory:  Positive for cough (Productive) and shortness of breath.   Gastrointestinal:  Positive for nausea and vomiting. Negative for abdominal pain.  Genitourinary:  Negative for difficulty urinating, dysuria, vaginal bleeding and vaginal discharge.  Musculoskeletal:  Negative for back pain.  Neurological:  Negative for dizziness, light-headedness and headaches.   Physical Exam   Blood pressure  119/85, pulse 83, temperature 98.4 F (36.9 C), temperature source Oral, height 5\' 2"  (1.575 m), weight 59.4 kg, SpO2 99 %.  Physical Exam Vitals reviewed.  Constitutional:      Appearance: Normal appearance. She is well-developed.  HENT:     Head: Normocephalic and atraumatic.  Eyes:     Conjunctiva/sclera: Conjunctivae normal.  Cardiovascular:     Rate and Rhythm: Normal rate and regular rhythm.  Pulmonary:     Effort: Pulmonary effort is normal.     Breath sounds: Normal breath sounds. No decreased breath sounds.  Abdominal:     Palpations: Abdomen is soft.     Comments: Gravid., Appears AGA  Musculoskeletal:        General: Normal range of motion.     Cervical back: Normal range of motion.  Skin:    General: Skin is warm and dry.  Neurological:     Mental Status: She is alert and oriented to person, place, and time.  Psychiatric:        Mood and Affect: Mood normal.        Behavior: Behavior normal.     Fetal Assessment 145 bpm, Mod Var, +Variable Decels, +Accels Toco: No ctx graphed  MAU Course   Results for orders placed or performed during the hospital encounter of 12/28/21 (from the past 24 hour(s))  Urinalysis, Routine w reflex microscopic Urine, Clean Catch     Status: Abnormal   Collection Time: 12/28/21  4:38 PM  Result Value Ref Range   Color, Urine AMBER (A) YELLOW   APPearance HAZY (A) CLEAR   Specific Gravity, Urine 1.011 1.005 - 1.030   pH 6.0 5.0 - 8.0   Glucose, UA NEGATIVE NEGATIVE mg/dL   Hgb urine dipstick NEGATIVE NEGATIVE   Bilirubin Urine NEGATIVE NEGATIVE   Ketones, ur NEGATIVE NEGATIVE mg/dL   Protein, ur NEGATIVE NEGATIVE mg/dL   Nitrite NEGATIVE NEGATIVE   Leukocytes,Ua MODERATE (A) NEGATIVE   RBC / HPF 0-5 0 - 5 RBC/hpf   WBC, UA 6-10 0 - 5 WBC/hpf   Bacteria, UA FEW (A) NONE SEEN   Squamous Epithelial / LPF 21-50 0 - 5   Mucus PRESENT    DG CHEST PORT 1 VIEW  Result Date: 12/28/2021 CLINICAL DATA:  Shortness of breath. EXAM:  PORTABLE CHEST 1 VIEW COMPARISON:  August 10, 2019. FINDINGS: The heart size  and mediastinal contours are within normal limits. Both lungs are clear. The visualized skeletal structures are unremarkable. IMPRESSION: No active disease. Electronically Signed   By: Lupita Raider M.D.   On: 12/28/2021 17:32    MDM PE EKG, Chest X Ray Labs: UA EFM Prescription Assessment and Plan  21 year old G2P0101  SIUP at 25.6 weeks Shortness of Breath  -EKG performed and normal: Reviewed by B. Mabe, MD who reports normal sinus rhythm, rate, and overall EKG. -Exam performed. -NST reactive and reassuring for GA.  -Await CXR results.   Cherre Robins MSN, CNM 12/28/2021, 5:13 PM   Reassessment (6:18 PM)  -Results as above. -Provider to bedside to discuss. -Discussed usage of albuterol inhaler for Bronchitis in addition to previously prescribed amoxicillin.  Patient agreeable. -Albuterol inhaler rx sent to pharmacy on file.  -Precautions given. -Keep next appt as scheduled. -Encouraged to call primary office or return to MAU if symptoms worsen or with the onset of new symptoms. -Discharged to home in stable condition.  Cherre Robins MSN, CNM Advanced Practice Provider, [facility]

## 2021-12-29 ENCOUNTER — Other Ambulatory Visit: Payer: Self-pay

## 2021-12-29 ENCOUNTER — Inpatient Hospital Stay (HOSPITAL_COMMUNITY)
Admission: AD | Admit: 2021-12-29 | Discharge: 2021-12-29 | Discharge status: Against Medical Advice | Payer: Medicaid Other | Attending: Obstetrics and Gynecology | Admitting: Obstetrics and Gynecology

## 2021-12-29 DIAGNOSIS — Z5321 Procedure and treatment not carried out due to patient leaving prior to being seen by health care provider: Secondary | ICD-10-CM | POA: Diagnosis present

## 2021-12-29 LAB — URINALYSIS, ROUTINE W REFLEX MICROSCOPIC
Bilirubin Urine: NEGATIVE
Glucose, UA: NEGATIVE mg/dL
Hgb urine dipstick: NEGATIVE
Ketones, ur: NEGATIVE mg/dL
Leukocytes,Ua: NEGATIVE
Nitrite: NEGATIVE
Protein, ur: 30 mg/dL — AB
Specific Gravity, Urine: 1.019 (ref 1.005–1.030)
pH: 6 (ref 5.0–8.0)

## 2021-12-29 NOTE — MAU Note (Signed)
Chelsea [name] is a 21 y.o. at [redacted]w[redacted]d here in MAU reporting: has been having nausea and emesis since starting the amoxicillin. Has been struggling with emesis when she tries to eat. Has been able to keep down white bread. 2-3 episodes of emesis in the past 24 hours. No pain, bleeding, or LOF. +FM  Onset of complaint: ongoing  Pain score: 0/10  Vitals:   12/29/21 1155  BP: 113/75  Pulse: 90  Resp: 16  Temp: 98.1 F (36.7 C)  SpO2: 97%     FHT:138  Lab orders placed from triage: UA

## 2021-12-29 NOTE — MAU Note (Signed)
Pt left AMA prior to coming back to triage. Stated provider sent prescription for antiemetic and no longer wants to be seen. AMA form signed. Instructed to return if symptoms persist or worsen. Pt verbalizes understanding.

## 2021-12-30 LAB — CULTURE, OB URINE: Culture: <10000 — AB

## 2022-01-07 ENCOUNTER — Inpatient Hospital Stay (HOSPITAL_COMMUNITY)
Admission: AD | Admit: 2022-01-07 | Discharge: 2022-01-07 | Disposition: A | Discharge status: Home / Self Care | Payer: Medicaid Other | Attending: Obstetrics and Gynecology | Admitting: Obstetrics and Gynecology

## 2022-01-07 ENCOUNTER — Encounter (HOSPITAL_COMMUNITY): Payer: Self-pay | Admitting: Obstetrics and Gynecology

## 2022-01-07 DIAGNOSIS — Z87891 Personal history of nicotine dependence: Secondary | ICD-10-CM | POA: Diagnosis not present

## 2022-01-07 DIAGNOSIS — O368120 Decreased fetal movements, second trimester, not applicable or unspecified: Secondary | ICD-10-CM | POA: Diagnosis present

## 2022-01-07 DIAGNOSIS — Z3A27 27 weeks gestation of pregnancy: Secondary | ICD-10-CM | POA: Insufficient documentation

## 2022-01-07 DIAGNOSIS — Z3689 Encounter for other specified antenatal screening: Secondary | ICD-10-CM

## 2022-01-07 DIAGNOSIS — O368190 Decreased fetal movements, unspecified trimester, not applicable or unspecified: Secondary | ICD-10-CM

## 2022-01-07 HISTORY — DX: Obstruction of bile duct: K83.1

## 2022-01-07 NOTE — MAU Note (Signed)
Chelsea [name] is a 21 y.o. at [redacted]w[redacted]d here in MAU reporting: dx with cholestasis on the 28th, put on medicine.  Now isn't feeling the baby move as much.  Called OB, was told to come in. No pain.  Onset of complaint: 2days Pain score: none Vitals:   01/07/22 1833  BP: 116/68  Pulse: 79  Resp: 16  Temp: 98.4 F (36.9 C)  SpO2: 99%     FHT:142 Lab orders placed from triage:

## 2022-01-07 NOTE — MAU Provider Note (Signed)
History     CSN: [id number]  Arrival date and time: 01/07/22 1816   Event Date/Time   First Provider Initiated Contact with Patient 01/07/22 1923      Chief Complaint  Patient presents with   Decreased Fetal Movement   HPI  Chelsea [name] is 21 y.o. female G70P0101 @ [redacted]w[redacted]d here in MAU with complaints of decreased fetal movement. Since she has been in MAU she has felt the baby move about 7 times in 30 mins. She reports hearing movement on the monitor. She was recently diagnosed with cholestasis in the office this week. She is taking Actigall.   OB History     Gravida  2   Para  1   Term      Preterm  1   AB      Living  1      SAB      IAB      Ectopic      Multiple  0   Live Births  1           Past Medical History:  Diagnosis Date   Cholestasis during pregnancy    Medical history non-contributory    Otitis media    Seasonal allergies     Past Surgical History:  Procedure Laterality Date   WISDOM TOOTH EXTRACTION  06/2019    Family History  Problem Relation Age of Onset   Hypertension Mother    Cancer Mother    Diabetes Mother    Migraines Mother    Seizures Mother    Anxiety disorder Mother    Hypertension Maternal Grandmother    Diabetes Maternal Grandmother    Migraines Maternal Grandmother    Depression Maternal Grandmother    Hypertension Maternal Grandfather    Migraines Maternal Grandfather    Depression Maternal Grandfather    Migraines Sister    Seizures Sister    Anxiety disorder Sister    ADD / ADHD Sister    Depression Sister    Bipolar disorder Sister    Autism Neg Hx    Schizophrenia Neg Hx     Social History   Tobacco Use   Smoking status: Former    Types: Cigarettes    Quit date: 09/18/2018    Years since quitting: 3.3   Smokeless tobacco: Former  Building services engineer Use: Every day  Substance Use Topics   Alcohol use: No   Drug use: No    Allergies:  Allergies  Allergen Reactions   Latex Itching and  Rash    Medications Prior to Admission  Medication Sig Dispense Refill Last Dose   aspirin EC 81 MG tablet Take 81 mg by mouth daily. Swallow whole.   Past Week   diphenhydrAMINE (BENADRYL) 25 MG tablet Take 25 mg by mouth every 6 (six) hours as needed for itching.   Past Week   Prenatal Vit-Fe Fumarate-FA (PRENATAL MULTIVITAMIN) TABS tablet Take 1 tablet by mouth daily at 12 noon.   Past Week   ursodiol (ACTIGALL) 300 MG capsule Take 300 mg by mouth 2 (two) times daily.   01/07/2022   albuterol (VENTOLIN HFA) 108 (90 Base) MCG/ACT inhaler Inhale 2 puffs into the lungs every 4 (four) hours as needed for wheezing or shortness of breath. 6.7 g 0    amoxicillin (AMOXIL) 500 MG tablet Take 500 mg by mouth 2 (two) times daily.      fluticasone (FLONASE) 50 MCG/ACT nasal spray Place 1 spray into both nostrils daily  for 3 days. 16 g 0    guaiFENesin (MUCINEX) 600 MG 12 hr tablet Take by mouth 2 (two) times daily.      ibuprofen (ADVIL) 600 MG tablet Take 1 tablet (600 mg total) by mouth every 6 (six) hours. 30 tablet 0    No results found for this or any previous visit (from the past 48 hour(s)).   No results found for this or any previous visit (from the past 48 hour(s)).   Review of Systems  Gastrointestinal:  Negative for abdominal pain.  Genitourinary:  Negative for vaginal bleeding and vaginal discharge.   Physical Exam   Blood pressure 108/73, pulse 81, temperature 98.4 F (36.9 C), temperature source Oral, resp. rate 16, height 5\' 2"  (1.575 m), weight 59 kg, SpO2 99 %.  Physical Exam Constitutional:      General: She is not in acute distress.    Appearance: Normal appearance. She is not ill-appearing, toxic-appearing or diaphoretic.  Skin:    General: Skin is warm.  Neurological:     Mental Status: She is alert and oriented to person, place, and time.  Psychiatric:        Behavior: Behavior normal.    Fetal Tracing: Baseline: 145 bpm Variability: moderate  Accelerations:  10x10 Decelerations: None Toco: None  MAU Course  Procedures  MDM  + fetal movement Reactive/Reassuring NST   Assessment and Plan   A:  1. Decreased fetal movement during pregnancy, antepartum, single or unspecified fetus   2. NST (non-stress test) reactive   3. [redacted] weeks gestation of pregnancy      P:  Dc home Return to MAU as needed Keep upcoming OB appointments  Jourdin Connors, , NP 01/07/2022 8:00 PM   14/02/2021 01/07/2022, 7:27 PM

## 2022-02-01 ENCOUNTER — Encounter (HOSPITAL_COMMUNITY): Payer: Self-pay | Admitting: Obstetrics and Gynecology

## 2022-02-01 ENCOUNTER — Inpatient Hospital Stay (HOSPITAL_COMMUNITY)
Admission: AD | Admit: 2022-02-01 | Discharge: 2022-02-01 | Disposition: A | Discharge status: Home / Self Care | Payer: Medicaid Other | Attending: Obstetrics and Gynecology | Admitting: Obstetrics and Gynecology

## 2022-02-01 DIAGNOSIS — Z1152 Encounter for screening for COVID-19: Secondary | ICD-10-CM | POA: Diagnosis not present

## 2022-02-01 DIAGNOSIS — O99513 Diseases of the respiratory system complicating pregnancy, third trimester: Secondary | ICD-10-CM | POA: Insufficient documentation

## 2022-02-01 DIAGNOSIS — J069 Acute upper respiratory infection, unspecified: Secondary | ICD-10-CM

## 2022-02-01 DIAGNOSIS — J029 Acute pharyngitis, unspecified: Secondary | ICD-10-CM | POA: Insufficient documentation

## 2022-02-01 DIAGNOSIS — H9209 Otalgia, unspecified ear: Secondary | ICD-10-CM | POA: Diagnosis not present

## 2022-02-01 DIAGNOSIS — Z3A30 30 weeks gestation of pregnancy: Secondary | ICD-10-CM | POA: Diagnosis present

## 2022-02-01 DIAGNOSIS — O98513 Other viral diseases complicating pregnancy, third trimester: Secondary | ICD-10-CM | POA: Insufficient documentation

## 2022-02-01 DIAGNOSIS — O212 Late vomiting of pregnancy: Secondary | ICD-10-CM | POA: Diagnosis not present

## 2022-02-01 LAB — URINALYSIS, ROUTINE W REFLEX MICROSCOPIC
Bacteria, UA: NONE SEEN
Glucose, UA: NEGATIVE mg/dL
Hgb urine dipstick: NEGATIVE
Ketones, ur: 20 mg/dL — AB
Nitrite: NEGATIVE
Protein, ur: NEGATIVE mg/dL
Specific Gravity, Urine: 1.019 (ref 1.005–1.030)
pH: 5 (ref 5.0–8.0)

## 2022-02-01 LAB — COMPREHENSIVE METABOLIC PANEL
ALT: 114 U/L — ABNORMAL HIGH (ref 0–44)
AST: 77 U/L — ABNORMAL HIGH (ref 15–41)
Albumin: 2.6 g/dL — ABNORMAL LOW (ref 3.5–5.0)
Alkaline Phosphatase: 181 U/L — ABNORMAL HIGH (ref 38–126)
Anion gap: 8 (ref 5–15)
BUN: 7 mg/dL (ref 6–20)
CO2: 22 mmol/L (ref 22–32)
Calcium: 9.1 mg/dL (ref 8.9–10.3)
Chloride: 103 mmol/L (ref 98–111)
Creatinine, Ser: 0.55 mg/dL (ref 0.44–1.00)
GFR, Estimated: >60 mL/min (ref >60)
Glucose, Bld: 94 mg/dL (ref 70–99)
Potassium: 3.5 mmol/L (ref 3.5–5.1)
Sodium: 133 mmol/L — ABNORMAL LOW (ref 135–145)
Total Bilirubin: 1.8 mg/dL — ABNORMAL HIGH (ref 0.3–1.2)
Total Protein: 6.3 g/dL — ABNORMAL LOW (ref 6.5–8.1)

## 2022-02-01 LAB — CBC WITH DIFFERENTIAL/PLATELET
Abs Immature Granulocytes: 0.04 10*3/uL (ref 0.00–0.07)
Basophils Absolute: 0 10*3/uL (ref 0.0–0.1)
Basophils Relative: 0 %
Eosinophils Absolute: 0.1 10*3/uL (ref 0.0–0.5)
Eosinophils Relative: 1 %
HCT: 34 % — ABNORMAL LOW (ref 36.0–46.0)
Hemoglobin: 12.4 g/dL (ref 12.0–15.0)
Immature Granulocytes: 0 %
Lymphocytes Relative: 14 %
Lymphs Abs: 1.3 10*3/uL (ref 0.7–4.0)
MCH: 31.9 pg (ref 26.0–34.0)
MCHC: 36.5 g/dL — ABNORMAL HIGH (ref 30.0–36.0)
MCV: 87.4 fL (ref 80.0–100.0)
Monocytes Absolute: 0.6 10*3/uL (ref 0.1–1.0)
Monocytes Relative: 6 %
Neutro Abs: 7.5 10*3/uL (ref 1.7–7.7)
Neutrophils Relative %: 79 %
Platelets: 275 10*3/uL (ref 150–400)
RBC: 3.89 MIL/uL (ref 3.87–5.11)
RDW: 11.6 % (ref 11.5–15.5)
WBC: 9.5 10*3/uL (ref 4.0–10.5)
nRBC: 0 % (ref 0.0–0.2)

## 2022-02-01 LAB — RESP PANEL BY RT-PCR (RSV, FLU A&B, COVID)  RVPGX2
Influenza A by PCR: NEGATIVE
Influenza B by PCR: NEGATIVE
Resp Syncytial Virus by PCR: NEGATIVE
SARS Coronavirus 2 by RT PCR: NEGATIVE

## 2022-02-01 LAB — FETAL FIBRONECTIN: Fetal Fibronectin: NEGATIVE

## 2022-02-01 LAB — WET PREP, GENITAL
Clue Cells Wet Prep HPF POC: NONE SEEN
Sperm: NONE SEEN
Trich, Wet Prep: NONE SEEN
WBC, Wet Prep HPF POC: >=10 — AB (ref <10)
Yeast Wet Prep HPF POC: NONE SEEN

## 2022-02-01 LAB — AMYLASE: Amylase: 46 U/L (ref 28–100)

## 2022-02-01 LAB — LIPASE, BLOOD: Lipase: 39 U/L (ref 11–51)

## 2022-02-01 MED ORDER — LACTATED RINGERS IV BOLUS
1000.0000 mL | Freq: Once | INTRAVENOUS | Status: AC
  Administered 2022-02-01: 1000 mL via INTRAVENOUS

## 2022-02-01 MED ORDER — NIFEDIPINE ER OSMOTIC RELEASE 30 MG PO TB24: 30.0000 mg | ORAL_TABLET | Freq: Every day | ORAL | 0 refills | Status: DC | PRN

## 2022-02-01 NOTE — MAU Note (Signed)
Chanteria Haggard Garczynski is a 21 y.o. at [redacted]w[redacted]d here in MAU reporting: started as regular allergies, a few days later, started having green and yellow mucous(nasal and coughing up). Started taking Mucinex. Throat and nose are irritated.  Fever has been here and there, more so at night.  Highest 100.8 2 days ago. Has been throwing up often since the 23rd. No  vag bleeding or leaking.  Reports +FM. Urine is very dark  Onset of complaint: last Monday Pain score: ears 5 throat 7 Vitals:   02/01/22 1748 02/01/22 1751  BP:  121/77  Pulse:  94  Resp:  17  Temp:  99.1 F (37.3 C)  SpO2: 99% 98%     FHT:140 Lab orders placed from triage:  urine

## 2022-02-01 NOTE — Discharge Instructions (Signed)
Stay well hydrated Take nifedipine (procardia) once daily if needed for contractions, if doesn't improve come to hospital  You have a viral infection that will resolve on its own over time.  Symptoms typically last 3-7 days but can stretch out to 2-3 weeks.  Unfortunately, antibiotics are not helpful for viral infections. Humidifier and saline nasal spray for nasal congestion Regular robitussin, cough drops for cough Warm salt water gargles for sore throat Mucinex with lots of water to help you cough up the mucous in your chest if needed Drink plenty of fluids and stay hydrated! Wash your hands frequently. Call your provider if you are not improving by 7-10 days.     Call the office or go to [facility] if: You begin to have strong, frequent contractions Your water breaks.  Sometimes it is a big gush of fluid, sometimes it is just a trickle that keeps getting your panties wet or running down your legs You have vaginal bleeding.  It is normal to have a small amount of spotting if your cervix was checked.  You don't feel your baby moving like normal.  If you don't, get you something to eat and drink and lay down and focus on feeling your baby move.  You should feel at least 10 movements in 2 hours.  If you don't, you should call the office or go to [facility].

## 2022-02-01 NOTE — MAU Provider Note (Cosign Needed Addendum)
History     CSN: [id number]  Arrival date and time: 02/01/22 1723   Event Date/Time   First Provider Initiated Contact with Patient 02/01/22 1827      Chief Complaint  Patient presents with   Cough   Fever   Sore Throat   Emesis   Otalgia   Patient is a 21 year old G2 P0101 at 30 weeks 6 days presenting for flulike symptoms for the past 6 days.  She reportsCough, congestion, mucus.  Taking over-the-counter Mucinex.  Also taking intermittent Tylenol.  She has known cholestasis of pregnancy.  Reports occasional fevers with temperature as high as 100.8 a few days ago.  Has been throwing up intermittently.  Denies any vaginal bleeding, gushing fluid.    OB History     Gravida  2   Para  1   Term      Preterm  1   AB      Living  1      SAB      IAB      Ectopic      Multiple  0   Live Births  1           Past Medical History:  Diagnosis Date   Cholestasis during pregnancy    Medical history non-contributory    Otitis media    Seasonal allergies     Past Surgical History:  Procedure Laterality Date   WISDOM TOOTH EXTRACTION  06/2019    Family History  Problem Relation Age of Onset   Hypertension Mother    Cancer Mother    Diabetes Mother    Migraines Mother    Seizures Mother    Anxiety disorder Mother    Hypertension Maternal Grandmother    Diabetes Maternal Grandmother    Migraines Maternal Grandmother    Depression Maternal Grandmother    Hypertension Maternal Grandfather    Migraines Maternal Grandfather    Depression Maternal Grandfather    Migraines Sister    Seizures Sister    Anxiety disorder Sister    ADD / ADHD Sister    Depression Sister    Bipolar disorder Sister    Autism Neg Hx    Schizophrenia Neg Hx     Social History   Tobacco Use   Smoking status: Former    Types: Cigarettes    Quit date: 09/18/2018    Years since quitting: 3.3   Smokeless tobacco: Former  Building services engineer Use: Every day  Substance  Use Topics   Alcohol use: No   Drug use: No    Allergies:  Allergies  Allergen Reactions   Amoxicillin Itching   Latex Itching and Rash    Medications Prior to Admission  Medication Sig Dispense Refill Last Dose   albuterol (VENTOLIN HFA) 108 (90 Base) MCG/ACT inhaler Inhale 2 puffs into the lungs every 4 (four) hours as needed for wheezing or shortness of breath. 6.7 g 0    aspirin EC 81 MG tablet Take 81 mg by mouth daily. Swallow whole.      diphenhydrAMINE (BENADRYL) 25 MG tablet Take 25 mg by mouth every 6 (six) hours as needed for itching.      fluticasone (FLONASE) 50 MCG/ACT nasal spray Place 1 spray into both nostrils daily for 3 days. 16 g 0    Prenatal Vit-Fe Fumarate-FA (PRENATAL MULTIVITAMIN) TABS tablet Take 1 tablet by mouth daily at 12 noon.      ursodiol (ACTIGALL) 300 MG capsule Take 300  mg by mouth 2 (two) times daily.       Review of Systems  Constitutional:  Positive for appetite change and fever.  HENT:  Positive for congestion and rhinorrhea.   Respiratory:  Negative for shortness of breath.   Cardiovascular:  Negative for chest pain.  Gastrointestinal:  Positive for nausea and vomiting.  Endocrine: Negative for polyuria.  Genitourinary:  Negative for vaginal bleeding, vaginal discharge and vaginal pain.  Musculoskeletal:  Positive for myalgias.  Neurological:  Negative for dizziness and headaches.  All other systems reviewed and are negative.  Physical Exam   Blood pressure 121/77, pulse 94, temperature 99.1 F (37.3 C), temperature source Oral, resp. rate 17, height 5\' 2"  (1.575 m), weight 59 kg, SpO2 98 %.  Physical Exam Vitals reviewed.  Constitutional:      Appearance: She is well-developed.  HENT:     Head: Normocephalic and atraumatic.     Nose: Congestion and rhinorrhea present.     Mouth/Throat:     Pharynx: Posterior oropharyngeal erythema present.  Eyes:     Conjunctiva/sclera: Conjunctivae normal.     Pupils: Pupils are equal,  round, and reactive to light.  Cardiovascular:     Rate and Rhythm: Normal rate and regular rhythm.  Pulmonary:     Effort: Pulmonary effort is normal.  Abdominal:     Palpations: Abdomen is soft.     Tenderness: There is no abdominal tenderness.  Skin:    General: Skin is warm.     Capillary Refill: Capillary refill takes less than 2 seconds.  Neurological:     General: No focal deficit present.     Mental Status: She is alert.  Psychiatric:        Mood and Affect: Mood normal.     MAU Course  Procedures  MDM Urinalysis Respiratory panel CBC CMP Amylase and lipase Fetal fibronectin Wet prep GC chlamydia   Assessment and Plan  Patient is a 21 year old G2, P1 at 30 weeks 6 days presenting for viral URI symptoms.  Also signs of preterm labor.  Viral URI Viral respiratory panel negative.  Fluid bolus given.  Continue symptomatic management.  Preterm labor FFN collected and negative.  Patient having contractions initially every 7 to 10 minutes but they increased to every 2 to 5 minutes for period of time.  Cervical exam fingertip.  Plan for repeat cervical exam and likely discharge with possible Procardia for toco lysis.  Patient stable at the time of discharge and handed off to nighttime provider for follow-up and probable discharge. 30 02/01/2022, 9:03 PM   Assumed care of pt from Dr. 02/03/2022 @ 2115, who has already spoken w/ Dr. 2116, who recommended repeat SVE after 1hr, if no change d/c home w/ procardia Reports she's feeling better, contractions have slowed Cat 1 FHR, 145, mod variability, +accel, no decels. Rare uc's SVE: FT/thick/ballotable by me, no change from last exam Resp panel neg, fFN neg, LFTs slightly elevated- has ICP  Keep u/s tomorrow and appt w/ OB on 12/28 Instructed to stay hydrated, gave relief measures for viral illnesses. If not improving after 7-10d notify her provider Rx procardia 30mg  daily prn contractions, if not  improving- come to MAU Reviewed other reasons to return- ptl, lof, vb (discussed spotting normal after SVE), decreased fm Pt verbalized understanding of all and no further questions. 1/29, CNM, [facility] 02/01/2022 9:34 PM

## 2022-02-03 LAB — GC/CHLAMYDIA PROBE AMP (~~LOC~~) NOT AT ARMC
Chlamydia: NEGATIVE
Comment: NEGATIVE
Comment: NORMAL
Neisseria Gonorrhea: NEGATIVE

## 2022-02-05 ENCOUNTER — Encounter (HOSPITAL_COMMUNITY): Payer: Self-pay | Admitting: Obstetrics and Gynecology

## 2022-02-05 ENCOUNTER — Other Ambulatory Visit: Payer: Self-pay

## 2022-02-05 ENCOUNTER — Inpatient Hospital Stay (HOSPITAL_COMMUNITY)
Admission: AD | Admit: 2022-02-05 | Discharge: 2022-02-05 | Disposition: A | Discharge status: Home / Self Care | Payer: Medicaid Other | Attending: Obstetrics and Gynecology | Admitting: Obstetrics and Gynecology

## 2022-02-05 DIAGNOSIS — Z3A31 31 weeks gestation of pregnancy: Secondary | ICD-10-CM | POA: Diagnosis not present

## 2022-02-05 DIAGNOSIS — O26643 Intrahepatic cholestasis of pregnancy, third trimester: Secondary | ICD-10-CM | POA: Diagnosis present

## 2022-02-05 DIAGNOSIS — O26613 Liver and biliary tract disorders in pregnancy, third trimester: Secondary | ICD-10-CM

## 2022-02-05 DIAGNOSIS — K831 Obstruction of bile duct: Secondary | ICD-10-CM

## 2022-02-05 DIAGNOSIS — Z3689 Encounter for other specified antenatal screening: Secondary | ICD-10-CM

## 2022-02-05 NOTE — MAU Provider Note (Signed)
History     CSN: [id number]  Arrival date and time: 02/05/22 1116   Event Date/Time   First Provider Initiated Contact with Patient 02/05/22 1155      Chief Complaint  Patient presents with   NST   Report Received from Dr. Reina Fuse that bile acids have increased from 40 on medication to 133, with liver enzymes >100. Per MD report she has spoken with patient to increase medication, come to MAU for fetal monitoring and follow up with office on Wednesday.  Chelsea [name] , a  21 y.o. G2P0101 at [redacted]w[redacted]d presents to MAU after being sent over from home following elevated bile acids and dx of Cholestasis in pregnancy.  Patient has no complaints at this time and endorses positive fetal movement. Patient reports hand itching earlier in the week but denies it at this time.          OB History     Gravida  2   Para  1   Term      Preterm  1   AB      Living  1      SAB      IAB      Ectopic      Multiple  0   Live Births  1           Past Medical History:  Diagnosis Date   Cholestasis during pregnancy    Medical history non-contributory    Otitis media    Seasonal allergies     Past Surgical History:  Procedure Laterality Date   WISDOM TOOTH EXTRACTION  06/2019    Family History  Problem Relation Age of Onset   Hypertension Mother    Cancer Mother    Migraines Mother    Seizures Mother    Anxiety disorder Mother    Migraines Sister    Seizures Sister    Anxiety disorder Sister    ADD / ADHD Sister    Depression Sister    Bipolar disorder Sister    Hypertension Maternal Grandmother    Diabetes Maternal Grandmother    Migraines Maternal Grandmother    Depression Maternal Grandmother    Hypertension Maternal Grandfather    Migraines Maternal Grandfather    Depression Maternal Grandfather    Autism Neg Hx    Schizophrenia Neg Hx     Social History   Tobacco Use   Smoking status: Former    Types: Cigarettes    Quit date: 09/18/2018     Years since quitting: 3.3   Smokeless tobacco: Former  Building services engineer Use: Former  Substance Use Topics   Alcohol use: No   Drug use: No    Allergies:  Allergies  Allergen Reactions   Amoxicillin Itching   Latex Itching and Rash    Medications Prior to Admission  Medication Sig Dispense Refill Last Dose   aspirin EC 81 MG tablet Take 81 mg by mouth daily. Swallow whole.   02/04/2022   diphenhydrAMINE (BENADRYL) 25 MG tablet Take 25 mg by mouth every 6 (six) hours as needed for itching.   02/05/2022   Prenatal Vit-Fe Fumarate-FA (PRENATAL MULTIVITAMIN) TABS tablet Take 1 tablet by mouth daily at 12 noon.   02/04/2022   ursodiol (ACTIGALL) 300 MG capsule Take 300 mg by mouth 2 (two) times daily.   02/05/2022   albuterol (VENTOLIN HFA) 108 (90 Base) MCG/ACT inhaler Inhale 2 puffs into the lungs every 4 (four) hours as needed for  wheezing or shortness of breath. 6.7 g 0    fluticasone (FLONASE) 50 MCG/ACT nasal spray Place 1 spray into both nostrils daily for 3 days. 16 g 0    NIFEdipine (PROCARDIA-XL/NIFEDICAL-XL) 30 MG 24 hr tablet Take 1 tablet (30 mg total) by mouth daily as needed. contractions 15 tablet 0 02/02/2022    Review of Systems  Constitutional:  Negative for chills, fatigue and fever.  Eyes:  Negative for pain and visual disturbance.  Respiratory:  Negative for apnea, shortness of breath and wheezing.   Cardiovascular:  Negative for chest pain and palpitations.  Gastrointestinal:  Negative for abdominal pain, constipation, diarrhea, nausea and vomiting.  Genitourinary:  Negative for difficulty urinating, dysuria, pelvic pain, vaginal bleeding, vaginal discharge and vaginal pain.  Musculoskeletal:  Negative for back pain.  Neurological:  Negative for seizures, weakness and headaches.  Psychiatric/Behavioral:  Negative for suicidal ideas.    Physical Exam   Blood pressure 124/85, pulse 96, temperature 98.6 F (37 C), temperature source Chelsea, resp. rate 18,  height 5' 2.5" (1.588 m), weight 58.6 kg, SpO2 96 %.  Physical Exam Vitals and nursing note reviewed.  Constitutional:      General: She is not in acute distress.    Appearance: Normal appearance.  HENT:     Head: Normocephalic.  Pulmonary:     Effort: Pulmonary effort is normal.  Musculoskeletal:     Cervical back: Normal range of motion.  Skin:    General: Skin is warm and dry.     Capillary Refill: Capillary refill takes less than 2 seconds.  Neurological:     Mental Status: She is alert and oriented to person, place, and time.  Psychiatric:        Mood and Affect: Mood normal.    FHT: 135 bpm with moderate variability. 10x10 accels present occasional variables with quick return to baseline. (Appropriate for gestational age.)  Toco: Ctx x2 (patient unaware)    MAU Course  Procedures Monitors applied and assessing Fetal kick count clicker provided.   MDM NST appropriate for gestational age.  Clicker clicked >15 times during MAU visit.  Plan to Discharge home  Consulted Dr. Reina Fuse. Reviewed patient presentation and current clinical picture. MD agreeable to plan of care.    Assessment and Plan   1. NST (non-stress test) reactive   2. [redacted] weeks gestation of pregnancy   3. Cholestasis during pregnancy in third trimester    - Reviewed reactive NST with patient and discharge instructions.  - Worsening signs of Cholestasis of pregnancy reviewed.  - Return precautions discussed.  - Preterm labor precautions reviewed.  - FHT appropriate for gestational age at time of discharge.  - Patient discharged home in stable condition and may return to MAU as needed.   Claudette Head, MSN CNM  02/05/2022, 11:55 AM

## 2022-02-05 NOTE — MAU Note (Signed)
Chelsea [name] is a 21 y.o. at [redacted]w[redacted]d here in MAU reporting: she was sent by [facility] for an NST.  Reports has cholestasis with the pregnancy.  Denies VB or LOF.  Endorses +FM. LMP: NA Onset of complaint: today Pain score: 0 Vitals:   02/05/22 1129  BP: 120/86  Pulse: 97  Resp: 18  Temp: 98.6 F (37 C)  SpO2: 98%     FHT: 146bpm Lab orders placed from triage:   None

## 2022-02-07 NOTE — L&D Delivery Note (Signed)
Delivery Note She progressed to complete and pushed well for 15-20 minutes.  FHT with some deep variable decels with pushing.  Median episiotomy cut to help facilitate delivery with decels.  At 7:07 PM a viable female was delivered via Vaginal, Spontaneous (Presentation:   Right Occiput Anterior).  APGAR: 6, 9; weight pending.   Placenta status: Spontaneous, Intact.  Cord: 3 vessels with the following complications: None.    Anesthesia: Epidural Episiotomy: Median Lacerations:  None Suture Repair: 3.0 vicryl rapide Est. Blood Loss (mL):  234  Mom to postpartum.  Baby to Couplet care / Skin to Skin.  At this point they do not want baby circumcised.     Blane Ohara Teia Freitas 03/19/2022, 7:33 PM

## 2022-02-13 ENCOUNTER — Inpatient Hospital Stay (HOSPITAL_COMMUNITY)
Admission: AD | Admit: 2022-02-13 | Discharge: 2022-02-14 | Disposition: A | Discharge status: Home / Self Care | Payer: Medicaid Other | Attending: Obstetrics and Gynecology | Admitting: Obstetrics and Gynecology

## 2022-02-13 DIAGNOSIS — O26643 Intrahepatic cholestasis of pregnancy, third trimester: Secondary | ICD-10-CM

## 2022-02-13 DIAGNOSIS — O4703 False labor before 37 completed weeks of gestation, third trimester: Secondary | ICD-10-CM

## 2022-02-13 DIAGNOSIS — K831 Obstruction of bile duct: Secondary | ICD-10-CM

## 2022-02-13 DIAGNOSIS — Z3A32 32 weeks gestation of pregnancy: Secondary | ICD-10-CM

## 2022-02-13 NOTE — MAU Note (Signed)
.  Chelsea [name] is a 22 y.o. at [redacted]w[redacted]d here in MAU reporting: ctx every 3 minutes for a little over an hour (7/10). She has cholestasis and takes ursodiol daily, reports that her itching has increased recently. Denies VB or LOF. Reports good FM. Last intercourse was a few hours before the ctx started.    LMP: N/A Onset of complaint: Today Pain score: 7/10 Vitals:   02/14/22 0000  BP: 114/83  Pulse: 97  Resp: 18  Temp: 97.8 F (36.6 C)  SpO2: 99%     FHT:143 Lab orders placed from triage:  UA

## 2022-02-13 NOTE — MAU Note (Incomplete)
.  Chelsea [name] is a 22 y.o. at [redacted]w[redacted]d here in MAU reporting: ctx every 3 minutes for a little over an hour (7/10). Denies VB or LOF. Reports good FM. Last intercourse was a few hours before the ctx started.   LMP: N/A Onset of complaint: Today Pain score: 7/10 Vitals:   02/14/22 0000  BP: 114/83  Pulse: 97  Resp: 18  Temp: 97.8 F (36.6 C)  SpO2: 99%     FHT:143 Lab orders placed from triage:

## 2022-02-14 ENCOUNTER — Encounter (HOSPITAL_COMMUNITY): Payer: Self-pay | Admitting: Obstetrics and Gynecology

## 2022-02-14 DIAGNOSIS — Z3A32 32 weeks gestation of pregnancy: Secondary | ICD-10-CM | POA: Diagnosis not present

## 2022-02-14 DIAGNOSIS — O26643 Intrahepatic cholestasis of pregnancy, third trimester: Secondary | ICD-10-CM | POA: Diagnosis not present

## 2022-02-14 DIAGNOSIS — O4703 False labor before 37 completed weeks of gestation, third trimester: Secondary | ICD-10-CM | POA: Diagnosis not present

## 2022-02-14 LAB — URINALYSIS, ROUTINE W REFLEX MICROSCOPIC
Bilirubin Urine: NEGATIVE
Glucose, UA: NEGATIVE mg/dL
Hgb urine dipstick: NEGATIVE
Ketones, ur: NEGATIVE mg/dL
Leukocytes,Ua: NEGATIVE
Nitrite: NEGATIVE
Protein, ur: NEGATIVE mg/dL
Specific Gravity, Urine: 1.004 — ABNORMAL LOW (ref 1.005–1.030)
pH: 7 (ref 5.0–8.0)

## 2022-02-14 LAB — POCT FERN TEST: POCT Fern Test: NEGATIVE

## 2022-02-14 MED ORDER — TERBUTALINE SULFATE 1 MG/ML IJ SOLN
0.2500 mg | Freq: Once | INTRAMUSCULAR | Status: AC
  Administered 2022-02-14: 0.25 mg via SUBCUTANEOUS
  Filled 2022-02-14: qty 1

## 2022-02-14 MED ORDER — DIPHENHYDRAMINE HCL 25 MG PO CAPS
50.0000 mg | ORAL_CAPSULE | Freq: Once | ORAL | Status: AC
  Administered 2022-02-14: 50 mg via ORAL
  Filled 2022-02-14: qty 2

## 2022-02-14 MED ORDER — NIFEDIPINE 10 MG PO CAPS
10.0000 mg | ORAL_CAPSULE | ORAL | Status: AC
  Administered 2022-02-14 (×3): 10 mg via ORAL
  Filled 2022-02-14 (×3): qty 1

## 2022-02-14 NOTE — MAU Provider Note (Signed)
History     CSN: [id number]  Arrival date and time: 02/13/22 2347   None     Chief Complaint  Patient presents with   Contractions   HPI Chelsea [name]. Mossberg is a 22 y.o. G2P0101 at [redacted]w[redacted]d who presents to MAU for contractions. Pregnancy is significant for cholestasis. Patient reports she had intercourse tonight and soon after contractions started. She reports contractions started approximately 2 hours prior to arrival and are about every 3-4 minutes apart. She currently rates her pain 8/10. She denies vaginal bleeding but is unsure if her water broke as she has going to the bathroom more frequently. She is not having to wear a pad. She reports normal fetal movement.   Patient receives [facility] and next appointment is scheduled tomorrow.   OB History     Gravida  2   Para  1   Term      Preterm  1   AB      Living  1      SAB      IAB      Ectopic      Multiple  0   Live Births  1           Past Medical History:  Diagnosis Date   Cholestasis during pregnancy    Medical history non-contributory    Otitis media    Seasonal allergies     Past Surgical History:  Procedure Laterality Date   WISDOM TOOTH EXTRACTION  06/2019    Family History  Problem Relation Age of Onset   Hypertension Mother    Cancer Mother    Migraines Mother    Seizures Mother    Anxiety disorder Mother    Migraines Sister    Seizures Sister    Anxiety disorder Sister    ADD / ADHD Sister    Depression Sister    Bipolar disorder Sister    Hypertension Maternal Grandmother    Diabetes Maternal Grandmother    Migraines Maternal Grandmother    Depression Maternal Grandmother    Hypertension Maternal Grandfather    Migraines Maternal Grandfather    Depression Maternal Grandfather    Autism Neg Hx    Schizophrenia Neg Hx     Social History   Tobacco Use   Smoking status: Former    Types: Cigarettes    Quit date: 09/18/2018    Years since quitting: 3.4    Smokeless tobacco: Former  Scientific laboratory technician Use: Former  Substance Use Topics   Alcohol use: No   Drug use: No    Allergies:  Allergies  Allergen Reactions   Amoxicillin Itching   Latex Itching and Rash    No medications prior to admission.   Review of Systems  Constitutional: Negative.   Respiratory: Negative.    Cardiovascular: Negative.   Gastrointestinal:  Positive for abdominal pain (contractions).  Genitourinary:  Positive for vaginal discharge. Negative for vaginal bleeding.  Neurological: Negative.    Physical Exam  Patient Vitals for the past 24 hrs:  BP Temp Temp src Pulse Resp SpO2 Height Weight  02/14/22 0342 131/84 -- -- -- -- -- -- --  02/14/22 0127 122/83 -- -- 81 -- -- -- --  02/14/22 0106 119/87 -- -- -- -- -- -- --  02/14/22 0045 129/87 -- -- -- -- -- -- --  02/14/22 0017 127/82 -- -- 87 -- -- -- --  02/14/22 0000 114/83 97.8 F (36.6 C) Oral 97  18 99 % 5\' 2"  (1.575 m) 59.1 kg   Physical Exam Vitals and nursing note reviewed. Exam conducted with a chaperone present.  Constitutional:      General: She is not in acute distress. Eyes:     Extraocular Movements: Extraocular movements intact.     Pupils: Pupils are equal, round, and reactive to light.  Cardiovascular:     Rate and Rhythm: Normal rate.  Pulmonary:     Effort: Pulmonary effort is normal.  Abdominal:     Palpations: Abdomen is soft.     Tenderness: There is no abdominal tenderness.     Comments: Gravid   Genitourinary:    Comments: Normal external female genitalia, vaginal walls pink with rugae, no pooling of amniotic fluid, small amount of white mucoid discharge, no bleeding, cervix visually closed without lesions/masses Musculoskeletal:        General: Normal range of motion.     Cervical back: Normal range of motion.  Skin:    General: Skin is warm and dry.  Neurological:     General: No focal deficit present.     Mental Status: She is alert and oriented to person, place,  and time.  Psychiatric:        Mood and Affect: Mood normal.        Behavior: Behavior normal.   Dilation: Closed Effacement (%): Thick Exam by:: [email or web address], CNM  Results for orders placed or performed during the hospital encounter of 02/13/22 (from the past 24 hour(s))  Urinalysis, Routine w reflex microscopic     Status: Abnormal   Collection Time: 02/14/22 12:09 AM  Result Value Ref Range   Color, Urine YELLOW YELLOW   APPearance CLEAR CLEAR   Specific Gravity, Urine 1.004 (L) 1.005 - 1.030   pH 7.0 5.0 - 8.0   Glucose, UA NEGATIVE NEGATIVE mg/dL   Hgb urine dipstick NEGATIVE NEGATIVE   Bilirubin Urine NEGATIVE NEGATIVE   Ketones, ur NEGATIVE NEGATIVE mg/dL   Protein, ur NEGATIVE NEGATIVE mg/dL   Nitrite NEGATIVE NEGATIVE   Leukocytes,Ua NEGATIVE NEGATIVE  Fern Test     Status: None   Collection Time: 02/14/22 12:44 AM  Result Value Ref Range   POCT Fern Test Negative = intact amniotic membranes    NST FHR: 140 bpm, moderate variability, +15x15 accels, no decels Toco: Q 2-72mins  MAU Course  Procedures  MDM UA Procardia series Terbutaline  UA negative. SSE without evidence of pooling of amniotic fluid. Fern slide negative. FFN deferred as patient had intercourse tonight. Cervix closed/thick  Given that she is contracting every 2-3 minutes, I discussed Procardia series vs Terbutaline vs IVF's. Patient opts for Procardia series. Procardia series initiated. After 3 doses, patient reports contractions are not better, so Terbutaline ordered.   After Terbutaline was given she reports contractions have completely resolved. Cervical exam offered again, patient declined  She is requesting Benadryl for itching. She is unsure if itching is related to cholestasis diagnosis or allergy to terbutaline as her mother has an allergy. Patient was closely watched for about an hour without any concerns for allergic reaction. Patient did not experience hives, swollen tongue/throat,  difficulty breathing, etc. Low suspicion for allergic reaction.   Assessment and Plan  [redacted] weeks gestation of pregnancy Preterm contractions Cholestasis of pregnancy  - Discharge home in stable condition - Return precautions given. Return to MAU as needed for new/worsening symptoms - Keep OB appointment as scheduled tomorrow morning  1m, CNM 02/14/2022, 3:54 AM

## 2022-02-27 ENCOUNTER — Other Ambulatory Visit: Payer: Self-pay

## 2022-02-27 ENCOUNTER — Ambulatory Visit
Admission: RE | Admit: 2022-02-27 | Discharge: 2022-02-27 | Disposition: A | Discharge status: Home / Self Care | Payer: Medicaid Other | Source: Ambulatory Visit | Attending: Emergency Medicine | Admitting: Emergency Medicine

## 2022-02-27 VITALS — BP 121/81 | HR 112 | Temp 101.4°F | Resp 18

## 2022-02-27 DIAGNOSIS — Z1152 Encounter for screening for COVID-19: Secondary | ICD-10-CM | POA: Diagnosis not present

## 2022-02-27 DIAGNOSIS — J069 Acute upper respiratory infection, unspecified: Secondary | ICD-10-CM

## 2022-02-27 LAB — POCT INFLUENZA A/B
Influenza A, POC: NEGATIVE
Influenza B, POC: NEGATIVE

## 2022-02-27 LAB — POCT RAPID STREP A (OFFICE): Rapid Strep A Screen: NEGATIVE

## 2022-02-27 MED ORDER — ACETAMINOPHEN 325 MG PO TABS
650.0000 mg | ORAL_TABLET | Freq: Once | ORAL | Status: AC
  Administered 2022-02-27: 650 mg via ORAL

## 2022-02-27 MED ORDER — CYCLOBENZAPRINE HCL 5 MG PO TABS: 5.0000 mg | ORAL_TABLET | Freq: Two times a day (BID) | ORAL | 0 refills | Status: DC | PRN

## 2022-02-27 NOTE — ED Provider Notes (Signed)
[facility]    CSN: [id number] Arrival date & time: 02/27/22  1147      History   Chief Complaint Chief Complaint  Patient presents with   Fever    Body aches, headache, fever, coughing, stuffy nose - Entered by patient    HPI Chelsea [name] is a 22 y.o. female.    presents for evaluation of fever, nasal congestion, rhinorrhea, sore throat, cough and vomiting beginning 6 days ago.  Fever peaking at 102.2.  Cough is productive at times, denies shortness of breath but endorses wheezing only at nighttime.  Last occurrence of vomiting this morning, emesis described as clear, last night emesis was mucus based.  Has been able to tolerate some food and liquids today.  Has attempted use of Tylenol for management.  Currently [redacted] weeks pregnant.  Known exposure to strep and RSV.   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 Authorizing Provider  albuterol (VENTOLIN  HFA) 108 (90 Base) MCG/ACT inhaler Inhale 2 puffs into the lungs every 4 (four) hours as needed for wheezing or shortness of breath. 12/28/21   Gavin Pound, CNM  aspirin EC 81 MG tablet Take 81 mg by mouth daily. Swallow whole.    [provider]  diphenhydrAMINE (BENADRYL) 25 MG tablet Take 25 mg by mouth every 6 (six) hours as needed for itching.    [provider]  fluticasone (FLONASE) 50 MCG/ACT nasal spray Place 1 spray into both nostrils daily for 3 days. 10/25/21 10/28/21  Teodora Medici, FNP  NIFEdipine (PROCARDIA-XL/NIFEDICAL-XL) 30 MG 24 hr tablet Take 1 tablet (30 mg total) by mouth daily as needed. contractions 02/01/22   Roma Schanz, CNM  Prenatal Vit-Fe Fumarate-FA (PRENATAL MULTIVITAMIN) TABS tablet Take 1 tablet by mouth daily at 12 noon.    [provider]  ursodiol (ACTIGALL) 300 MG capsule Take 300 mg by mouth 2 (two) times daily.    [provider]    Family History Family History  Problem Relation Age of Onset   Hypertension Mother    Cancer Mother    Migraines Mother    Seizures Mother    Anxiety disorder Mother  Migraines Sister    Seizures Sister    Anxiety disorder Sister    ADD / ADHD Sister    Depression Sister    Bipolar disorder Sister    Hypertension Maternal Grandmother    Diabetes Maternal Grandmother    Migraines Maternal Grandmother    Depression Maternal Grandmother    Hypertension Maternal Grandfather    Migraines Maternal Grandfather    Depression Maternal Grandfather    Autism Neg Hx    Schizophrenia Neg Hx     Social History Social History   Tobacco Use   Smoking status: Former    Types: Cigarettes    Quit date: 09/18/2018    Years since quitting: 3.4   Smokeless tobacco: Former  Building services engineer Use: Former  Substance Use Topics   Alcohol use: No   Drug use: No     Allergies   Amoxicillin and Latex   Review of Systems Review of Systems  Constitutional:  Positive for fever.  Negative for activity change, appetite change, chills, diaphoresis, fatigue and unexpected weight change.  HENT:  Positive for congestion, rhinorrhea and sore throat. Negative for dental problem, drooling, ear discharge, ear pain, facial swelling, hearing loss, mouth sores, nosebleeds, postnasal drip, sinus pressure, sinus pain, sneezing, tinnitus, trouble swallowing and voice change.   Respiratory:  Positive for cough. Negative for apnea, choking, chest tightness, shortness of breath, wheezing and stridor.   Cardiovascular: Negative.   Gastrointestinal:  Positive for vomiting. Negative for abdominal distention, abdominal pain, anal bleeding, blood in stool, constipation, diarrhea, nausea and rectal pain.  Skin: Negative.   Neurological: Negative.      Physical Exam Triage Vital Signs ED Triage Vitals [02/27/22 1200]  Enc Vitals Group     BP 121/81     Pulse Rate (!) 112     Resp 18     Temp (!) 101.4 F (38.6 C)     Temp Source Oral     SpO2 99 %     Weight      Height      Head Circumference      Peak Flow      Pain Score 5     Pain Loc      Pain Edu?      Excl. in GC?    No data found.  Updated Vital Signs BP 121/81 (BP Location: Left Arm)   Pulse (!) 112   Temp (!) 101.4 F (38.6 C) (Oral)   Resp 18   SpO2 99%   Visual Acuity Right Eye Distance:   Left Eye Distance:   Bilateral Distance:    Right Eye Near:   Left Eye Near:    Bilateral Near:     Physical Exam Constitutional:      Appearance: Normal appearance.  HENT:     Head: Normocephalic.     Right Ear: Tympanic membrane, ear canal and external ear normal.     Left Ear: Tympanic membrane, ear canal and external ear normal.     Nose: Congestion and rhinorrhea present.     Mouth/Throat:     Mouth: Mucous membranes are moist.     Pharynx: No posterior oropharyngeal erythema.  Eyes:     Extraocular Movements: Extraocular movements intact.  Cardiovascular:     Rate and Rhythm: Normal rate and regular  rhythm.     Pulses: Normal pulses.     Heart sounds: Normal heart sounds.  Pulmonary:     Effort: Pulmonary effort is  normal.     Breath sounds: Normal breath sounds.  Skin:    General: Skin is warm and dry.  Neurological:     Mental Status: She is alert and oriented to person, place, and time. Mental status is at baseline.      UC Treatments / Results  Labs (all labs ordered are listed, but only abnormal results are displayed) Labs Reviewed  POCT RAPID STREP A (OFFICE)  POCT INFLUENZA A/B    EKG   Radiology No results found.  Procedures Procedures (including critical care time)  Medications Ordered in UC Medications  acetaminophen (TYLENOL) tablet 650 mg (has no administration in time range)    Initial Impression / Assessment and Plan / UC Course  I have reviewed the triage vital signs and the nursing notes.  Pertinent labs & imaging results that were available during my care of the patient were reviewed by me and considered in my medical decision making (see chart for details).  Viral URI with cough  Strep, flu test negative, COVID test pending, will rule out additional infections before concluding symptoms most likely RSV, discussed this with patient, fever of 101.4 with associated tachycardia in triage, patient while ill-appearing is in no signs of distress nor toxic, body aches are most worrisome symptom therefore Flexeril prescribed, currently [redacted] weeks pregnant, given list of safe over-the-counter medications, may also reach out to obstetrician for further options, may follow-up with urgent care as needed, Final Clinical Impressions(s) / UC Diagnoses   Final diagnoses:  None   Discharge Instructions   None    ED Prescriptions   None    PDMP not reviewed this encounter.   Hans Eden, NP 02/27/22 1341

## 2022-02-27 NOTE — ED Triage Notes (Signed)
Pt here for fever and sore throat with body aches; pt is [redacted] weeks pregnant

## 2022-02-27 NOTE — Discharge Instructions (Signed)
Your symptoms today are most likely being caused by a virus and should steadily improve in time it can take up to 10 days before you truly start to see a turnaround however things will get better    Symptoms are most likely caused by RSV however use you are [redacted] weeks pregnant we will rule out additional causes  Strep, flu test negative  COVID test pending up to 24 hours, if positive you have been sick for 5 days and have already met quarantine guidelines and may continue activity wearing mask  You may use Flexeril every morning and every evening as needed to help with body aches, be mindful this medication may make you feel sleepy  For management of fevers please use Tylenol consistently to keep at [place] your packet is a list of over-the-counter medications that are safe for you to use to further help manage your symptoms, may also reach out to your gynecologist/obstetrician for additional options

## 2022-02-28 LAB — OB RESULTS CONSOLE GBS: GBS: POSITIVE

## 2022-02-28 LAB — SARS CORONAVIRUS 2 (TAT 6-24 HRS): SARS Coronavirus 2: NEGATIVE

## 2022-03-11 ENCOUNTER — Telehealth (HOSPITAL_COMMUNITY): Payer: Self-pay | Admitting: *Deleted

## 2022-03-11 NOTE — Telephone Encounter (Signed)
Preadmission screen  

## 2022-03-14 ENCOUNTER — Encounter (HOSPITAL_COMMUNITY): Payer: Self-pay

## 2022-03-15 ENCOUNTER — Telehealth (HOSPITAL_COMMUNITY): Payer: Self-pay | Admitting: *Deleted

## 2022-03-15 ENCOUNTER — Encounter (HOSPITAL_COMMUNITY): Payer: Self-pay | Admitting: *Deleted

## 2022-03-15 NOTE — Telephone Encounter (Signed)
Preadmission screen  

## 2022-03-18 ENCOUNTER — Other Ambulatory Visit: Payer: Self-pay | Admitting: Obstetrics and Gynecology

## 2022-03-18 DIAGNOSIS — K831 Obstruction of bile duct: Secondary | ICD-10-CM

## 2022-03-19 ENCOUNTER — Inpatient Hospital Stay (HOSPITAL_COMMUNITY)
Admission: RE | Admit: 2022-03-19 | Discharge: 2022-03-21 | DRG: 805 | Disposition: A | Discharge status: Home / Self Care | Payer: Medicaid Other | Attending: Obstetrics and Gynecology | Admitting: Obstetrics and Gynecology

## 2022-03-19 ENCOUNTER — Other Ambulatory Visit: Payer: Self-pay

## 2022-03-19 ENCOUNTER — Inpatient Hospital Stay (HOSPITAL_COMMUNITY): Payer: Medicaid Other

## 2022-03-19 ENCOUNTER — Inpatient Hospital Stay (HOSPITAL_COMMUNITY): Payer: Medicaid Other | Admitting: Anesthesiology

## 2022-03-19 ENCOUNTER — Encounter (HOSPITAL_COMMUNITY): Payer: Self-pay | Admitting: Obstetrics and Gynecology

## 2022-03-19 DIAGNOSIS — Z87891 Personal history of nicotine dependence: Secondary | ICD-10-CM | POA: Diagnosis not present

## 2022-03-19 DIAGNOSIS — O26613 Liver and biliary tract disorders in pregnancy, third trimester: Principal | ICD-10-CM | POA: Diagnosis present

## 2022-03-19 DIAGNOSIS — O99824 Streptococcus B carrier state complicating childbirth: Secondary | ICD-10-CM | POA: Diagnosis present

## 2022-03-19 DIAGNOSIS — K831 Obstruction of bile duct: Secondary | ICD-10-CM | POA: Diagnosis present

## 2022-03-19 DIAGNOSIS — O26893 Other specified pregnancy related conditions, third trimester: Secondary | ICD-10-CM | POA: Diagnosis present

## 2022-03-19 DIAGNOSIS — Z3A37 37 weeks gestation of pregnancy: Secondary | ICD-10-CM | POA: Diagnosis not present

## 2022-03-19 DIAGNOSIS — Z6791 Unspecified blood type, Rh negative: Secondary | ICD-10-CM | POA: Diagnosis not present

## 2022-03-19 DIAGNOSIS — O26643 Intrahepatic cholestasis of pregnancy, third trimester: Secondary | ICD-10-CM | POA: Diagnosis present

## 2022-03-19 HISTORY — DX: Headache, unspecified: R51.9

## 2022-03-19 HISTORY — DX: Anxiety disorder, unspecified: F41.9

## 2022-03-19 LAB — CBC
HCT: 34.5 % — ABNORMAL LOW (ref 36.0–46.0)
Hemoglobin: 12.3 g/dL (ref 12.0–15.0)
MCH: 29.9 pg (ref 26.0–34.0)
MCHC: 35.7 g/dL (ref 30.0–36.0)
MCV: 83.9 fL (ref 80.0–100.0)
Platelets: 294 10*3/uL (ref 150–400)
RBC: 4.11 MIL/uL (ref 3.87–5.11)
RDW: 11.3 % — ABNORMAL LOW (ref 11.5–15.5)
WBC: 11.6 10*3/uL — ABNORMAL HIGH (ref 4.0–10.5)
nRBC: 0 % (ref 0.0–0.2)

## 2022-03-19 LAB — TYPE AND SCREEN
ABO/RH(D): AB NEG
Antibody Screen: POSITIVE

## 2022-03-19 MED ORDER — MAGNESIUM HYDROXIDE 400 MG/5ML PO SUSP: 30.0000 mL | ORAL | Status: DC | PRN

## 2022-03-19 MED ORDER — OXYTOCIN BOLUS FROM INFUSION
333.0000 mL | Freq: Once | INTRAVENOUS | Status: AC
  Administered 2022-03-19: 333 mL via INTRAVENOUS

## 2022-03-19 MED ORDER — LACTATED RINGERS IV SOLN
500.0000 mL | Freq: Once | INTRAVENOUS | Status: AC
  Administered 2022-03-19: 500 mL via INTRAVENOUS

## 2022-03-19 MED ORDER — DIBUCAINE (PERIANAL) 1 % EX OINT: 1.0000 | TOPICAL_OINTMENT | CUTANEOUS | Status: DC | PRN

## 2022-03-19 MED ORDER — ACETAMINOPHEN 325 MG PO TABS
650.0000 mg | ORAL_TABLET | ORAL | Status: DC | PRN
  Administered 2022-03-20 (×2): 650 mg via ORAL
  Filled 2022-03-19 (×2): qty 2

## 2022-03-19 MED ORDER — SIMETHICONE 80 MG PO CHEW
80.0000 mg | CHEWABLE_TABLET | ORAL | Status: DC | PRN
  Administered 2022-03-20: 80 mg via ORAL
  Filled 2022-03-19: qty 1

## 2022-03-19 MED ORDER — OXYCODONE HCL 5 MG PO TABS: 5.0000 mg | ORAL_TABLET | ORAL | Status: DC | PRN

## 2022-03-19 MED ORDER — ACETAMINOPHEN 325 MG PO TABS: 650.0000 mg | ORAL_TABLET | ORAL | Status: DC | PRN

## 2022-03-19 MED ORDER — EPHEDRINE 5 MG/ML INJ: 10.0000 mg | INTRAVENOUS | Status: DC | PRN

## 2022-03-19 MED ORDER — COCONUT OIL OIL: 1.0000 | TOPICAL_OIL | Status: DC | PRN

## 2022-03-19 MED ORDER — MEASLES, MUMPS & RUBELLA VAC IJ SOLR: 0.5000 mL | Freq: Once | INTRAMUSCULAR | Status: DC

## 2022-03-19 MED ORDER — PHENYLEPHRINE 80 MCG/ML (10ML) SYRINGE FOR IV PUSH (FOR BLOOD PRESSURE SUPPORT): 80.0000 ug | PREFILLED_SYRINGE | INTRAVENOUS | Status: DC | PRN

## 2022-03-19 MED ORDER — TETANUS-DIPHTH-ACELL PERTUSSIS 5-2.5-18.5 LF-MCG/0.5 IM SUSY: 0.5000 mL | PREFILLED_SYRINGE | Freq: Once | INTRAMUSCULAR | Status: DC

## 2022-03-19 MED ORDER — WITCH HAZEL-GLYCERIN EX PADS: 1.0000 | MEDICATED_PAD | CUTANEOUS | Status: DC | PRN

## 2022-03-19 MED ORDER — LACTATED RINGERS IV SOLN
500.0000 mL | INTRAVENOUS | Status: DC | PRN
  Administered 2022-03-19 (×2): 500 mL via INTRAVENOUS

## 2022-03-19 MED ORDER — ONDANSETRON HCL 4 MG PO TABS: 4.0000 mg | ORAL_TABLET | ORAL | Status: DC | PRN

## 2022-03-19 MED ORDER — LIDOCAINE HCL (PF) 1 % IJ SOLN
INTRAMUSCULAR | Status: DC | PRN
  Administered 2022-03-19: 4 mL via EPIDURAL
  Administered 2022-03-19: 5 mL via EPIDURAL

## 2022-03-19 MED ORDER — CEFAZOLIN SODIUM-DEXTROSE 1-4 GM/50ML-% IV SOLN
1.0000 g | Freq: Three times a day (TID) | INTRAVENOUS | Status: DC
  Filled 2022-03-19 (×2): qty 50

## 2022-03-19 MED ORDER — OXYCODONE HCL 5 MG PO TABS: 10.0000 mg | ORAL_TABLET | ORAL | Status: DC | PRN

## 2022-03-19 MED ORDER — METHYLERGONOVINE MALEATE 0.2 MG PO TABS: 0.2000 mg | ORAL_TABLET | ORAL | Status: DC | PRN

## 2022-03-19 MED ORDER — LIDOCAINE HCL (PF) 1 % IJ SOLN
30.0000 mL | INTRAMUSCULAR | Status: AC | PRN
  Administered 2022-03-19: 30 mL via SUBCUTANEOUS
  Filled 2022-03-19: qty 30

## 2022-03-19 MED ORDER — METHYLERGONOVINE MALEATE 0.2 MG/ML IJ SOLN: 0.2000 mg | INTRAMUSCULAR | Status: DC | PRN

## 2022-03-19 MED ORDER — ONDANSETRON HCL 4 MG/2ML IJ SOLN: 4.0000 mg | Freq: Four times a day (QID) | INTRAMUSCULAR | Status: DC | PRN

## 2022-03-19 MED ORDER — OXYTOCIN-SODIUM CHLORIDE 30-0.9 UT/500ML-% IV SOLN: 2.5000 [IU]/h | INTRAVENOUS | Status: DC

## 2022-03-19 MED ORDER — LACTATED RINGERS IV SOLN: INTRAVENOUS | Status: DC

## 2022-03-19 MED ORDER — SOD CITRATE-CITRIC ACID 500-334 MG/5ML PO SOLN: 30.0000 mL | ORAL | Status: DC | PRN

## 2022-03-19 MED ORDER — ONDANSETRON HCL 4 MG/2ML IJ SOLN: 4.0000 mg | INTRAMUSCULAR | Status: DC | PRN

## 2022-03-19 MED ORDER — ALBUTEROL SULFATE (2.5 MG/3ML) 0.083% IN NEBU: 2.5000 mg | INHALATION_SOLUTION | RESPIRATORY_TRACT | Status: DC | PRN

## 2022-03-19 MED ORDER — CEFAZOLIN SODIUM-DEXTROSE 2-4 GM/100ML-% IV SOLN
2.0000 g | Freq: Once | INTRAVENOUS | Status: AC
  Administered 2022-03-19: 2 g via INTRAVENOUS
  Filled 2022-03-19: qty 100

## 2022-03-19 MED ORDER — BENZOCAINE-MENTHOL 20-0.5 % EX AERO
1.0000 | INHALATION_SPRAY | CUTANEOUS | Status: DC | PRN
  Administered 2022-03-19: 1 via TOPICAL
  Filled 2022-03-19: qty 56

## 2022-03-19 MED ORDER — ZOLPIDEM TARTRATE 5 MG PO TABS: 5.0000 mg | ORAL_TABLET | Freq: Every evening | ORAL | Status: DC | PRN

## 2022-03-19 MED ORDER — IBUPROFEN 600 MG PO TABS
600.0000 mg | ORAL_TABLET | Freq: Four times a day (QID) | ORAL | Status: DC
  Administered 2022-03-19 – 2022-03-21 (×7): 600 mg via ORAL
  Filled 2022-03-19 (×7): qty 1

## 2022-03-19 MED ORDER — DIPHENHYDRAMINE HCL 25 MG PO CAPS
25.0000 mg | ORAL_CAPSULE | Freq: Four times a day (QID) | ORAL | Status: DC | PRN
  Administered 2022-03-20 – 2022-03-21 (×2): 25 mg via ORAL
  Filled 2022-03-19 (×2): qty 1

## 2022-03-19 MED ORDER — TERBUTALINE SULFATE 1 MG/ML IJ SOLN: 0.2500 mg | Freq: Once | INTRAMUSCULAR | Status: DC | PRN

## 2022-03-19 MED ORDER — OXYTOCIN-SODIUM CHLORIDE 30-0.9 UT/500ML-% IV SOLN
1.0000 m[IU]/min | INTRAVENOUS | Status: DC
  Administered 2022-03-19: 2 m[IU]/min via INTRAVENOUS
  Filled 2022-03-19: qty 500

## 2022-03-19 MED ORDER — OXYCODONE-ACETAMINOPHEN 5-325 MG PO TABS: 2.0000 | ORAL_TABLET | ORAL | Status: DC | PRN

## 2022-03-19 MED ORDER — OXYCODONE-ACETAMINOPHEN 5-325 MG PO TABS: 1.0000 | ORAL_TABLET | ORAL | Status: DC | PRN

## 2022-03-19 MED ORDER — PRENATAL MULTIVITAMIN CH
1.0000 | ORAL_TABLET | Freq: Every day | ORAL | Status: DC
  Administered 2022-03-20 – 2022-03-21 (×2): 1 via ORAL
  Filled 2022-03-19 (×2): qty 1

## 2022-03-19 MED ORDER — FENTANYL-BUPIVACAINE-NACL 0.5-0.125-0.9 MG/250ML-% EP SOLN
12.0000 mL/h | EPIDURAL | Status: DC | PRN
  Administered 2022-03-19: 11 mL/h via EPIDURAL
  Filled 2022-03-19: qty 250

## 2022-03-19 MED ORDER — SENNOSIDES-DOCUSATE SODIUM 8.6-50 MG PO TABS
2.0000 | ORAL_TABLET | Freq: Every day | ORAL | Status: DC
  Administered 2022-03-20 – 2022-03-21 (×2): 2 via ORAL
  Filled 2022-03-19 (×2): qty 2

## 2022-03-19 MED ORDER — DIPHENHYDRAMINE HCL 50 MG/ML IJ SOLN: 12.5000 mg | INTRAMUSCULAR | Status: DC | PRN

## 2022-03-19 NOTE — Anesthesia Procedure Notes (Deleted)
Epidural

## 2022-03-19 NOTE — H&P (Signed)
Chelsea [name] is a 22 y.o. female, G2 P80, EGA [redacted] weeks with EDC 2-28 presenting for induction for cholestasis of pregnancy.  Has been on ursodiol and hydroxyzine, reactive NSTs.  OB History     Gravida  2   Para  1   Term      Preterm  1   AB      Living  1      SAB      IAB      Ectopic      Multiple  0   Live Births  1          Past Medical History:  Diagnosis Date   Anxiety    Cholestasis    Cholestasis during pregnancy    Headache    History of cholestasis during pregnancy    Medical history non-contributory    Otitis media    Preterm labor    Seasonal allergies    Past Surgical History:  Procedure Laterality Date   TONSILLECTOMY     WISDOM TOOTH EXTRACTION  06/2019   Family History: family history includes ADD / ADHD in her sister; Anxiety disorder in her mother and sister; Asthma in her mother and sister; Bipolar disorder in her sister; Cancer in her mother; Depression in her maternal grandfather, maternal grandmother, and sister; Diabetes in her maternal grandmother; Hypertension in her maternal grandfather, maternal grandmother, and mother; Kidney disease in her maternal grandfather; Migraines in her maternal grandfather, maternal grandmother, mother, and sister; Miscarriages / Stillbirths in her mother and sister; Seizures in her mother and sister; Stroke in her mother. Social History:  reports that she quit smoking about 3 years ago. Her smoking use included cigarettes. She has quit using smokeless tobacco. She reports that she does not drink alcohol and does not use drugs.     Maternal Diabetes: No Genetic Screening: Normal Maternal Ultrasounds/Referrals: Normal Fetal Ultrasounds or other Referrals:  None Maternal Substance Abuse:  No Significant Maternal Medications:  Meds include: Other:  Significant Maternal Lab Results:  Group B Strep positive Number of Prenatal Visits:greater than 3 verified prenatal visits Other Comments:   ursodiol,  hydroxyzine  Review of Systems  Respiratory: Negative.    Cardiovascular: Negative.    Maternal Medical History:  Fetal activity: Perceived fetal activity is normal.   Prenatal Complications - Diabetes: none.  AROM clear Dilation: 4 Effacement (%): 70 Station: -1 Exam by:: Dr. Willis Modena Blood pressure (!) 136/97, pulse 72, temperature (!) 97.3 F (36.3 C), temperature source Axillary, resp. rate 16, height 5' 2"$  (1.575 m), weight 59.8 kg, SpO2 99 %. Maternal Exam:  Uterine Assessment: Contraction strength is moderate.  Contraction frequency is regular.  Abdomen: Patient reports no abdominal tenderness. Estimated fetal weight is 6 lbs.   Fetal presentation: vertex Introitus: Normal vulva. Normal vagina.  Amniotic fluid character: clear. Pelvis: adequate for delivery.     Fetal Exam Fetal Monitor Review: Mode: ultrasound.   Baseline rate: 130.  Variability: moderate (6-25 bpm).   Pattern: accelerations present and variable decelerations.   Fetal State Assessment: Category II - tracings are indeterminate.   Physical Exam Vitals reviewed.  Constitutional:      Appearance: Normal appearance.  Cardiovascular:     Rate and Rhythm: Normal rate and regular rhythm.  Pulmonary:     Effort: Pulmonary effort is normal. No respiratory distress.  Abdominal:     Palpations: Abdomen is soft.  Genitourinary:    General: Normal vulva.  Neurological:  Mental Status: She is alert.     Prenatal labs: ABO, Rh: --/--/AB NEG (02/10 1156) Antibody: POS (02/10 1156) Rubella: Immune (07/27 0000) RPR: Nonreactive (07/27 0000)  HBsAg: Negative (07/27 0000)  HIV: Non-reactive (07/27 0000)  GBS: Positive/-- (01/22 0000)   Assessment/Plan: IUP at 37 weeks admitted for induction for cholestasis, +GBS.  Admitted around noon, on pitocin, on Ancef for GBS prophylaxis.  Just received epidural, then AROM clear.  Monitor progress, anticipate SVD   Clarene Duke 03/19/2022, 5:33  PM

## 2022-03-19 NOTE — Plan of Care (Signed)

## 2022-03-19 NOTE — Anesthesia Procedure Notes (Addendum)
Epidural Patient location during procedure: OB Start time: 03/19/2022 3:42 PM End time: 03/19/2022 3:51 PM  Staffing Anesthesiologist: Josephine Igo, MD Performed: anesthesiologist   Preanesthetic Checklist Completed: patient identified, IV checked, site marked, risks and benefits discussed, surgical consent, monitors and equipment checked, pre-op evaluation and timeout performed  Epidural Patient position: sitting Prep: DuraPrep and site prepped and draped Patient monitoring: continuous pulse ox and blood pressure Approach: midline Location: L3-L4 Injection technique: LOR air  Needle:  Needle type: Tuohy  Needle gauge: 17 G Needle length: 9 cm and 9 Needle insertion depth: 4 cm Catheter type: closed end flexible Catheter size: 19 Gauge Catheter at skin depth: 9 cm Test dose: negative and Other  Assessment Events: blood not aspirated, no cerebrospinal fluid, injection not painful, no injection resistance, no paresthesia and negative IV test  Additional Notes Patient identified. Risks and benefits discussed including failed block, incomplete  Pain control, post dural puncture headache, nerve damage, paralysis, blood pressure Changes, nausea, vomiting, reactions to medications-both toxic and allergic and post Partum back pain. All questions were answered. Patient expressed understanding and wished to proceed. Sterile technique was used throughout procedure. Epidural site was Dressed with sterile barrier dressing. No paresthesias, signs of intravascular injection Or signs of intrathecal spread were encountered.  Patient was more comfortable after the epidural was dosed. Please see RN's note for documentation of vital signs and FHR which are stable. Reason for block:procedure for pain

## 2022-03-19 NOTE — Lactation Note (Signed)
This note was copied from a baby's chart. Lactation Consultation Note  Patient Name: Chelsea [name] [id number] Date: 03/19/2022 Reason for consult: Initial assessment;Early term 37-38.6wks Age:24 hours Birth Parent latched infant on her left breast using the football hold infant latched with depth , sustained latch and was still breastfeeding after 10 minutes when [facility] left the room. Birth Parent will continue to BF infant according to hunger cues, on demand, STS. Birth Parent knows to call [facility] for further latch assistance if needed. LC discussed infant's input and out put, importance of maternal rest, diet and hydration. Mom made aware of O/P services, breastfeeding support groups, community resources, and our phone # for post-discharge questions.    Maternal Data Has patient been taught Hand Expression?: Yes Does the patient have breastfeeding experience prior to this delivery?: Yes How long did the patient breastfeed?: Per Birth Parent, her daughter did not latch she pumped 2 weeks and then stopped  Feeding Mother's Current Feeding Choice: Breast Milk and Formula  LATCH Score Latch: Grasps breast easily, tongue down, lips flanged, rhythmical sucking.  Audible Swallowing: A few with stimulation  Type of Nipple: Everted at rest and after stimulation  Comfort (Breast/Nipple): Soft / non-tender  Hold (Positioning): Assistance needed to correctly position infant at breast and maintain latch.  LATCH Score: 8   Lactation Tools Discussed/Used    Interventions Interventions: Breast feeding basics reviewed;Adjust position;Assisted with latch;Support pillows;Breast massage;Skin to skin;Position options;Expressed milk;Hand express;Breast compression;LC Services brochure;Education  Discharge Pump: DEBP;Hands Free;Personal  Consult Status Consult Status: Follow-up Date: 03/20/22 Follow-up type: In-patient    Chelsea [name] 03/19/2022, 11:56 PM

## 2022-03-19 NOTE — Anesthesia Preprocedure Evaluation (Signed)
Anesthesia Evaluation  Patient identified by MRN, date of birth, ID band Patient awake    Reviewed: Allergy & Precautions, Patient's Chart, lab work & pertinent test results  Airway Mallampati: II       Dental no notable dental hx.    Pulmonary neg pulmonary ROS, former smoker   Pulmonary exam normal        Cardiovascular negative cardio ROS Normal cardiovascular exam     Neuro/Psych  Headaches  Anxiety        GI/Hepatic negative GI ROS, Neg liver ROS,,,  Endo/Other  negative endocrine ROS    Renal/GU negative Renal ROS  negative genitourinary   Musculoskeletal negative musculoskeletal ROS (+)    Abdominal Normal abdominal exam  (+)   Peds  Hematology  (+) Blood dyscrasia, anemia   Anesthesia Other Findings   Reproductive/Obstetrics (+) Pregnancy                              Anesthesia Physical Anesthesia Plan  ASA: 2  Anesthesia Plan:    Post-op Pain Management: Minimal or no pain anticipated   Induction:   PONV Risk Score and Plan:   Airway Management Planned: Natural Airway  Additional Equipment:   Intra-op Plan:   Post-operative Plan:   Informed Consent: I have reviewed the patients History and Physical, chart, labs and discussed the procedure including the risks, benefits and alternatives for the proposed anesthesia with the patient or authorized representative who has indicated his/her understanding and acceptance.       Plan Discussed with: Anesthesiologist  Anesthesia Plan Comments:          Anesthesia Quick Evaluation

## 2022-03-19 NOTE — Lactation Note (Signed)
This note was copied from a baby's chart. Lactation Consultation Note  Patient Name: Chelsea [name] [id number] Date: 03/19/2022 Reason for consult: L&D Initial assessment;Early term 37-38.6wks Age:24 hours P2, ETI female infant, Birth Parent latched infant on her left breast using the football hold position, infant latched with depth, sustained latch and was still breastfeeding when [facility] left the room after 12 minutes. Birth Parent will continue to BF by cues, on demand, 8 + times within 24 hours, STS. Birth Parent knows to call [facility] for further latch assistance if needed. Maternal Data    Feeding Mother's Current Feeding Choice: Breast Milk and Formula  LATCH Score Latch: Grasps breast easily, tongue down, lips flanged, rhythmical sucking.  Audible Swallowing: A few with stimulation  Type of Nipple: Everted at rest and after stimulation  Comfort (Breast/Nipple): Soft / non-tender  Hold (Positioning): Assistance needed to correctly position infant at breast and maintain latch.  LATCH Score: 8   Lactation Tools Discussed/Used    Interventions Interventions: Adjust position;Support pillows;Assisted with latch;Skin to skin;Position options;Education;Breast massage;Breast compression  Discharge    Consult Status Consult Status: Follow-up from L&D    Eulis Canner 03/19/2022, 8:19 PM

## 2022-03-20 LAB — RPR: RPR Ser Ql: NONREACTIVE

## 2022-03-20 MED ORDER — RHO D IMMUNE GLOBULIN 1500 UNIT/2ML IJ SOSY
300.0000 ug | PREFILLED_SYRINGE | Freq: Once | INTRAMUSCULAR | Status: AC
  Administered 2022-03-20: 300 ug via INTRAVENOUS
  Filled 2022-03-20: qty 2

## 2022-03-20 NOTE — Lactation Note (Signed)
This note was copied from a baby's chart. Lactation Consultation Note  Patient Name: Chelsea [name] [id number] Date: 03/20/2022 Reason for consult: Follow-up assessment;Mother's request;Early term 37-38.6wks;Breastfeeding assistance;Infant weight loss (2.07% WL) Age:24 hours  LC entered the room and the infant was being changed.  According to the birth parent, the infant recently fed on the other side.  The birth parent latched the infant to the left breast with his body facing the ceiling and his neck towards the breast.  LC assisted the birth parent in turning the infant.  The infant latched deeply with tongue down and lips flanged, sucking was rhythmic, some jaw extensions were noted.  The birth parent was given coconut oil and comfort gels for her sore nipples per her request.  The birth parent is aware not to use coconut oil and comfort gels at the same time.  She knows to run the comfort gels under cool water for 1-2 sec, let them sit for 2 min, then place them on her breast.  The birth parent knows to keep the coconut oil in the bag and use cotton swabs to put some on her finger.  The birth parent will call RN/LC for assistance with breastfeeding.   LATCH Score Latch: Grasps breast easily, tongue down, lips flanged, rhythmical sucking.  Audible Swallowing: Spontaneous and intermittent  Type of Nipple: Everted at rest and after stimulation  Comfort (Breast/Nipple): Soft / non-tender  Hold (Positioning): Assistance needed to correctly position infant at breast and maintain latch.  LATCH Score: 9   Lactation Tools Discussed/Used Tools: Coconut oil;Comfort gels  Interventions Interventions: Assisted with latch;Adjust position;Support pillows;Comfort gels;Coconut oil  Discharge    Consult Status Consult Status: Follow-up Date: 03/21/22 Follow-up type: In-patient    [name] 03/20/2022, 3:01 PM

## 2022-03-20 NOTE — Progress Notes (Signed)
MOB was referred for history of depression/anxiety. * Referral screened out by Clinical Social Worker because none of the following criteria appear to apply: ~ History of anxiety/depression during this pregnancy, or of post-partum depression following prior delivery. ~ Diagnosis of anxiety and/or depression within last 3 years OR * MOB's symptoms currently being treated with medication and/or therapy. MOB has an active prescription for hydroxyzine PRN.  Please contact the Clinical Social Worker if needs arise, by [facility] request, or if MOB scores greater than 9/yes to question 10 on [place] Postpartum Depression Screen.  Signed,  Berniece Salines, MSW, LCSWA, LCASA 03/20/2022 9:44 AM

## 2022-03-20 NOTE — Anesthesia Postprocedure Evaluation (Signed)
Anesthesia Post Note  Patient: Chelsea [name]  Procedure(s) Performed: AN AD HOC LABOR EPIDURAL     Patient location during evaluation: Mother Baby Anesthesia Type: Epidural Level of consciousness: awake and alert Pain management: pain level controlled Vital Signs Assessment: post-procedure vital signs reviewed and stable Respiratory status: spontaneous breathing, nonlabored ventilation and respiratory function stable Cardiovascular status: stable Postop Assessment: no headache, no backache, epidural receding, no apparent nausea or vomiting, patient able to bend at knees, able to ambulate and adequate PO intake Anesthetic complications: no   No notable events documented.  Last Vitals:  Vitals:   03/20/22 0608 03/20/22 0930  BP: 121/80 124/89  Pulse: 75 84  Resp: 18 20  Temp: 36.8 C 36.4 C  SpO2: 100% 98%    Last Pain:  Vitals:   03/20/22 0930  TempSrc: Oral  PainSc:    Pain Goal:                   [employer]

## 2022-03-20 NOTE — Progress Notes (Signed)
PPD #1 No problems Afeb, VSS Fundus firm, NT at U-1 Continue routine postpartum care 

## 2022-03-21 LAB — RH IG WORKUP (INCLUDES ABO/RH)
Fetal Screen: NEGATIVE
Gestational Age(Wks): 37
Unit division: 0

## 2022-03-21 MED ORDER — IBUPROFEN 200 MG PO TABS: 600.0000 mg | ORAL_TABLET | Freq: Four times a day (QID) | ORAL | Status: DC | PRN

## 2022-03-21 MED ORDER — ACETAMINOPHEN 325 MG PO TABS: 650.0000 mg | ORAL_TABLET | Freq: Four times a day (QID) | ORAL | Status: DC | PRN

## 2022-03-21 NOTE — Plan of Care (Signed)
  Problem: Education: Goal: Knowledge of condition will improve Outcome: Adequate for Discharge Goal: Individualized Educational Video(s) Outcome: Adequate for Discharge Goal: Individualized Newborn Educational Video(s) Outcome: Adequate for Discharge   Problem: Activity: Goal: Will verbalize the importance of balancing activity with adequate rest periods Outcome: Adequate for Discharge Goal: Ability to tolerate increased activity will improve Outcome: Adequate for Discharge   Problem: Coping: Goal: Ability to identify and utilize available resources and services will improve Outcome: Adequate for Discharge   Problem: Life Cycle: Goal: Chance of risk for complications during the postpartum period will decrease Outcome: Adequate for Discharge   Problem: Role Relationship: Goal: Ability to demonstrate positive interaction with newborn will improve Outcome: Adequate for Discharge   Problem: Skin Integrity: Goal: Demonstration of wound healing without infection will improve Outcome: Adequate for Discharge   Problem: Education: Goal: Knowledge of condition will improve Outcome: Adequate for Discharge Goal: Individualized Educational Video(s) Outcome: Adequate for Discharge Goal: Individualized Newborn Educational Video(s) Outcome: Adequate for Discharge   Problem: Activity: Goal: Will verbalize the importance of balancing activity with adequate rest periods Outcome: Adequate for Discharge Goal: Ability to tolerate increased activity will improve Outcome: Adequate for Discharge   Problem: Coping: Goal: Ability to identify and utilize available resources and services will improve Outcome: Adequate for Discharge   Problem: Life Cycle: Goal: Chance of risk for complications during the postpartum period will decrease Outcome: Adequate for Discharge   Problem: Role Relationship: Goal: Ability to demonstrate positive interaction with newborn will improve Outcome: Adequate  for Discharge   Problem: Skin Integrity: Goal: Demonstration of wound healing without infection will improve Outcome: Adequate for Discharge   Problem: Education: Goal: Knowledge of General Education information will improve Description: Including pain rating scale, medication(s)/side effects and non-pharmacologic comfort measures Outcome: Adequate for Discharge   Problem: Health Behavior/Discharge Planning: Goal: Ability to manage health-related needs will improve Outcome: Adequate for Discharge   Problem: Clinical Measurements: Goal: Ability to maintain clinical measurements within normal limits will improve Outcome: Adequate for Discharge Goal: Will remain free from infection Outcome: Adequate for Discharge Goal: Diagnostic test results will improve Outcome: Adequate for Discharge Goal: Respiratory complications will improve Outcome: Adequate for Discharge Goal: Cardiovascular complication will be avoided Outcome: Adequate for Discharge   Problem: Activity: Goal: Risk for activity intolerance will decrease Outcome: Adequate for Discharge   Problem: Nutrition: Goal: Adequate nutrition will be maintained Outcome: Adequate for Discharge   Problem: Coping: Goal: Level of anxiety will decrease Outcome: Adequate for Discharge   Problem: Elimination: Goal: Will not experience complications related to bowel motility Outcome: Adequate for Discharge Goal: Will not experience complications related to urinary retention Outcome: Adequate for Discharge   Problem: Pain Managment: Goal: General experience of comfort will improve Outcome: Adequate for Discharge   Problem: Safety: Goal: Ability to remain free from injury will improve Outcome: Adequate for Discharge   Problem: Skin Integrity: Goal: Risk for impaired skin integrity will decrease Outcome: Adequate for Discharge

## 2022-03-21 NOTE — Progress Notes (Signed)
Post Partum Day 2 Subjective: Doing well this AM. Ambulating, voiding, tolerating PO. Pain controlled. Minimal lochia. Breastfeeding.   Objective: Patient Vitals for the past 24 hrs:  BP Temp Temp src Pulse Resp SpO2  03/21/22 0542 (!) 124/90 97.9 F (36.6 C) Oral 73 20 100 %  03/20/22 2100 127/85 98.3 F (36.8 C) Oral 84 20 98 %  03/20/22 1656 131/83 98 F (36.7 C) Oral 79 20 98 %  03/20/22 0930 124/89 97.6 F (36.4 C) Oral 84 20 98 %    Physical Exam:  General: alert, cooperative, and no distress Lochia: appropriate Uterine Fundus: firm DVT Evaluation: No evidence of DVT seen on physical exam.  Recent Labs    03/19/22 1159  WBC 11.6*  HGB 12.3  HCT 34.5*  PLT 294    No results for input(s): "NA", "K", "CL", "CO2CT", "BUN", "CREATININE", "GLUCOSE", "BILITOT", "ALT", "AST", "ALKPHOS", "PROT", "ALBUMIN" in the last 72 hours.  No results for input(s): "CALCIUM", "MG", "PHOS" in the last 72 hours.  No results for input(s): "PROTIME", "APTT", "INR" in the last 72 hours.  No results for input(s): "PROTIME", "APTT", "INR", "FIBRINOGEN" in the last 72 hours. Assessment/Plan:  Chelsea [name] 22 y.o. G2P1102 PPD#2 sp SVD 1. PPC: routine PP care 2. Does not want circumcision 3. Rh neg, baby Rh pos: s/p Rhogam 4. Dispo: meeting postpartum milestones, discharge home. Instructions reviewed.    LOS: 2 days   Rowland Lathe 03/21/2022, 8:42 AM

## 2022-03-21 NOTE — Discharge Summary (Signed)
Postpartum Discharge Summary  Date of Service updated 03/21/22      Patient Name: Chelsea [name] DOB: 2000-08-24 MRN: [id number]  Date of admission: 03/19/2022 Delivery date:03/19/2022  Delivering provider: Willis Modena, TODD  Date of discharge: 03/21/2022  Admitting diagnosis: Cholestasis of pregnancy in third trimester [O26.613, K83.1] Intrauterine pregnancy: 103w3d    Secondary diagnosis:  Principal Problem:   Cholestasis of pregnancy in third trimester  Additional problems: none    Discharge diagnosis: Term Pregnancy Delivered                                              Post partum procedures:rhogam Augmentation: AROM and Pitocin Complications: None  Hospital course: Induction of Labor With Vaginal Delivery   22y.o. yo [id number] 329w3das admitted to the hospital 03/19/2022 for induction of labor.  Indication for induction: Cholestasis of pregnancy.  Patient had an labor course complicated by nothing Membrane Rupture Time/Date: 5:17 PM ,03/19/2022   Delivery Method:Vaginal, Spontaneous  Episiotomy: Median  Lacerations:  None  Details of delivery can be found in separate delivery note.  Patient had a postpartum course complicated by nothing. Patient is discharged home 03/21/22.  Newborn Data: Birth date:03/19/2022  Birth time:7:07 PM  Gender:Female  Living status:Living  Apgars:6 ,9  Weight:2950 g   Magnesium Sulfate received: No BMZ received: No Rhophylac:Yes MMR:N/A T-DaP:Given prenatally Flu: No Transfusion:No  Physical exam  Vitals:   03/20/22 0930 03/20/22 1656 03/20/22 2100 03/21/22 0542  BP: 124/89 131/83 127/85 (!) 124/90  Pulse: 84 79 84 73  Resp: 20 20 20 20  $ Temp: 97.6 F (36.4 C) 98 F (36.7 C) 98.3 F (36.8 C) 97.9 F (36.6 C)  TempSrc: Oral Oral Oral Oral  SpO2: 98% 98% 98% 100%  Weight:      Height:       General: alert, cooperative, and no distress Lochia: appropriate Uterine Fundus: firm Incision: N/A DVT Evaluation: No evidence of  DVT seen on physical exam. Labs: Lab Results  Component Value Date   WBC 11.6 (H) 03/19/2022   HGB 12.3 03/19/2022   HCT 34.5 (L) 03/19/2022   MCV 83.9 03/19/2022   PLT 294 03/19/2022      Latest Ref Rng & Units 02/01/2022    6:22 PM  CMP  Glucose 70 - 99 mg/dL 94   BUN 6 - 20 mg/dL 7   Creatinine 0.44 - 1.00 mg/dL 0.55   Sodium 135 - 145 mmol/L 133   Potassium 3.5 - 5.1 mmol/L 3.5   Chloride 98 - 111 mmol/L 103   CO2 22 - 32 mmol/L 22   Calcium 8.9 - 10.3 mg/dL 9.1   Total Protein 6.5 - 8.1 g/dL 6.3   Total Bilirubin 0.3 - 1.2 mg/dL 1.8   Alkaline Phos 38 - 126 U/L 181   AST 15 - 41 U/L 77   ALT 0 - 44 U/L 114    Edinburgh Score:    03/19/2022   10:30 PM  Edinburgh Postnatal Depression Scale Screening Tool  I have been able to laugh and see the funny side of things. 0  I have looked forward with enjoyment to things. 0  I have blamed myself unnecessarily when things went wrong. 0  I have been anxious or worried for no good reason. 0  I have felt scared or panicky for no good  reason. 0  Things have been getting on top of me. 0  I have been so unhappy that I have had difficulty sleeping. 0  I have felt sad or miserable. 0  I have been so unhappy that I have been crying. 0  The thought of harming myself has occurred to me. 0  Edinburgh Postnatal Depression Scale Total 0      After visit meds:  Allergies as of 03/21/2022       Reactions   Amoxicillin Itching   Latex Itching, Rash        Medication List     STOP taking these medications    aspirin EC 81 MG tablet   cyclobenzaprine 5 MG tablet Commonly known as: FLEXERIL   NIFEdipine 30 MG 24 hr tablet Commonly known as: PROCARDIA-XL/NIFEDICAL-XL   ursodiol 300 MG capsule Commonly known as: ACTIGALL       TAKE these medications    acetaminophen 325 MG tablet Commonly known as: Tylenol Take 2 tablets (650 mg total) by mouth every 6 (six) hours as needed (for pain scale < 4).   albuterol 108  (90 Base) MCG/ACT inhaler Commonly known as: VENTOLIN HFA Inhale 2 puffs into the lungs every 4 (four) hours as needed for wheezing or shortness of breath.   diphenhydrAMINE 25 MG tablet Commonly known as: BENADRYL Take 25 mg by mouth every 6 (six) hours as needed for itching.   fluticasone 50 MCG/ACT nasal spray Commonly known as: FLONASE Place 1 spray into both nostrils daily for 3 days.   ibuprofen 200 MG tablet Commonly known as: ADVIL Take 3 tablets (600 mg total) by mouth every 6 (six) hours as needed.   prenatal multivitamin Tabs tablet Take 1 tablet by mouth daily at 12 noon.         Discharge home in stable condition Infant Feeding: Breast Infant Disposition:home with mother Discharge instruction: per After Visit Summary and Postpartum booklet. Activity: Advance as tolerated. Pelvic rest for 6 weeks.  Diet: routine diet Anticipated Birth Control: Unsure Postpartum Appointment:6 weeks Additional Postpartum F/U:  none Future Appointments:No future appointments. Follow up Visit:  [facility], [facility]. Schedule an appointment as soon as possible for a visit.   Why: 6 weeks Contact information: [address] [place] [address] [phone]                     03/21/2022 Rowland Lathe, MD

## 2022-03-21 NOTE — Lactation Note (Incomplete)
This note was copied from a baby's chart. Lactation Consultation Note  Patient Name: Chelsea [name] [id number] Date: 03/21/2022  Reason for consult: Follow-up assessment;Early term 37-38.6wks;Nipple pain/trauma  Age:23 hours P2, 37.3 GA, Mother has sore nipples   Mother has sore nipples: left nipple is red, inflamed and tender and right nipple is red, blister on tip and positional stripe. Mother is using silver cup-like nipple covers that she brought from home along with coconut oil applied for nipple soreness. She states the nipple covers are "very helpful". She has comfort gels and has not used yet.   Mother requested assist with latch. "[name]" was fussy and crying as mother had to use the phone for her lunch order. She asked the LC to feed him a bottle. Baby suckled firmly and gulps were noted with swallows. Paced feeding was used. Baby had recently fed 16 ml in the last hour. He took 9 ml and when burped, spit full volume of milk up. He calmed and baby was placed to breast.   At breast, baby was "chomping", gape was wide, tug was noted and mother denied pain. Jaw extension was not noted. Baby fell asleep.   When baby was crying, it was noted he had a short frenulum near the tip on the tongue and tongue slightly formed a heart-shape with extension. Mother reports her sister's child had to have a frenotomy due to pain with breastfeeding. Instructed mother to have OP pediatrician to assess tongue and report ongoing nipple soreness and  breakdown. Mother states she will be fine to pump and feed baby by bottle with EBM, if needed.  Would like an OP LC referral made for assist. Referral sent.     Feeding Mother's Current Feeding Choice: Breast Milk and Formula Nipple Type: Slow - flow  LATCH Score                    Lactation Tools Discussed/Used    Interventions    Discharge Discharge Education: Outpatient recommendation;Outpatient Epic message sent;Engorgement and  breast care;Warning signs for feeding baby Pump: DEBP;Personal  Consult Status Consult Status: Follow-up Date: 03/21/22 Follow-up type: In-patient    Stana Bunting M 03/21/2022, 1:07 PM

## 2022-03-24 ENCOUNTER — Encounter (HOSPITAL_COMMUNITY): Payer: Self-pay | Admitting: Obstetrics and Gynecology

## 2022-03-24 ENCOUNTER — Inpatient Hospital Stay (HOSPITAL_COMMUNITY)
Admission: AD | Admit: 2022-03-24 | Discharge: 2022-03-24 | Disposition: A | Discharge status: Home / Self Care | Payer: Medicaid Other | Attending: Obstetrics and Gynecology | Admitting: Obstetrics and Gynecology

## 2022-03-24 DIAGNOSIS — R519 Headache, unspecified: Secondary | ICD-10-CM

## 2022-03-24 DIAGNOSIS — O9089 Other complications of the puerperium, not elsewhere classified: Secondary | ICD-10-CM | POA: Diagnosis present

## 2022-03-24 DIAGNOSIS — Z8679 Personal history of other diseases of the circulatory system: Secondary | ICD-10-CM

## 2022-03-24 DIAGNOSIS — O165 Unspecified maternal hypertension, complicating the puerperium: Secondary | ICD-10-CM | POA: Insufficient documentation

## 2022-03-24 HISTORY — DX: Personal history of other complications of pregnancy, childbirth and the puerperium: Z86.79

## 2022-03-24 LAB — CBC
HCT: 31.9 % — ABNORMAL LOW (ref 36.0–46.0)
Hemoglobin: 11 g/dL — ABNORMAL LOW (ref 12.0–15.0)
MCH: 30 pg (ref 26.0–34.0)
MCHC: 34.5 g/dL (ref 30.0–36.0)
MCV: 86.9 fL (ref 80.0–100.0)
Platelets: 390 10*3/uL (ref 150–400)
RBC: 3.67 MIL/uL — ABNORMAL LOW (ref 3.87–5.11)
RDW: 11.5 % (ref 11.5–15.5)
WBC: 7.2 10*3/uL (ref 4.0–10.5)
nRBC: 0 % (ref 0.0–0.2)

## 2022-03-24 LAB — COMPREHENSIVE METABOLIC PANEL
ALT: 55 U/L — ABNORMAL HIGH (ref 0–44)
AST: 30 U/L (ref 15–41)
Albumin: 2.7 g/dL — ABNORMAL LOW (ref 3.5–5.0)
Alkaline Phosphatase: 279 U/L — ABNORMAL HIGH (ref 38–126)
Anion gap: 12 (ref 5–15)
BUN: 9 mg/dL (ref 6–20)
CO2: 24 mmol/L (ref 22–32)
Calcium: 8.9 mg/dL (ref 8.9–10.3)
Chloride: 103 mmol/L (ref 98–111)
Creatinine, Ser: 0.51 mg/dL (ref 0.44–1.00)
GFR, Estimated: >60 mL/min (ref >60)
Glucose, Bld: 102 mg/dL — ABNORMAL HIGH (ref 70–99)
Potassium: 3.9 mmol/L (ref 3.5–5.1)
Sodium: 139 mmol/L (ref 135–145)
Total Bilirubin: 0.2 mg/dL — ABNORMAL LOW (ref 0.3–1.2)
Total Protein: 6.3 g/dL — ABNORMAL LOW (ref 6.5–8.1)

## 2022-03-24 MED ORDER — ACETAMINOPHEN-CAFFEINE 500-65 MG PO TABS
2.0000 | ORAL_TABLET | Freq: Once | ORAL | Status: AC
  Administered 2022-03-24: 2 via ORAL
  Filled 2022-03-24: qty 2

## 2022-03-24 NOTE — MAU Note (Signed)
.  Chelsea [name] is a 22 y.o. at [redacted]w[redacted]d here in MAU reporting: PP 2/10 vag delivery. Has had headache since 2/13 has taken IB and tylenol without much relief. B/p elevate at 144/90. Yesterday. Started on 30mg  of Procardia XL OB told her to come in since headache is still there and b/p is still 130's -140's 80-90's.   Onset of complaint: 02/19/22 Pain score: 7-8 Vitals:   03/24/22 1527  BP: 125/88  Pulse: 95  Resp: 18  Temp: 98.5 F (36.9 C)     FHT:n/a Lab orders placed from triage:

## 2022-03-24 NOTE — MAU Provider Note (Signed)
Chief Complaint: Hypertension   Event Date/Time   First Provider Initiated Contact with Patient 03/24/22 1559      SUBJECTIVE HPI: Chelsea [name] is a 22 y.o. [id number] who is postpartum day 5 following NSVD who presents to maternity admissions reporting headache x 2 days, elevated BP on home cuff and in the office yesterday with labs drawn.  She has tried  increased PO fluids,Tylenol 1000 mg, and ibuprofen 400 mg PO for her headache which have not worked. The headache was mostly left sided, behind her left eye but is more frontal today. It is otherwise unchanged in intensity since onset.    HPI  Past Medical History:  Diagnosis Date   Anxiety    Cholestasis    Cholestasis during pregnancy    Headache    History of cholestasis during pregnancy    Medical history non-contributory    Otitis media    Preterm labor    Seasonal allergies    Past Surgical History:  Procedure Laterality Date   TONSILLECTOMY     WISDOM TOOTH EXTRACTION  06/2019   Social History   Socioeconomic History   Marital status: Single    Spouse name: Not on file   Number of children: Not on file   Years of education: Not on file   Highest education level: Not on file  Occupational History    Comment: unemployed  Tobacco Use   Smoking status: Former    Types: Cigarettes    Quit date: 09/18/2018    Years since quitting: 3.5   Smokeless tobacco: Former  Scientific laboratory technician Use: Former   Substances: Nicotine, Flavoring  Substance and Sexual Activity   Alcohol use: No   Drug use: No   Sexual activity: Yes    Birth control/protection: None    Comment: vesectomy for FOB  Other Topics Concern   Not on file  Social History Narrative   She is in school online, lives at home with mom. She enjoys eating, sleeping, and playing with her dog.    Social Determinants of Health   Financial Resource Strain: Not on file  Food Insecurity: No Food Insecurity (03/19/2022)   Hunger Vital Sign    Worried About  Running Out of Food in the Last Year: Never true    Ran Out of Food in the Last Year: Never true  Transportation Needs: No Transportation Needs (03/19/2022)   PRAPARE - Hydrologist (Medical): No    Lack of Transportation (Non-Medical): No  Physical Activity: Not on file  Stress: Not on file  Social Connections: Not on file  Intimate Partner Violence: Not At Risk (03/19/2022)   Humiliation, Afraid, Rape, and Kick questionnaire    Fear of Current or Ex-Partner: No    Emotionally Abused: No    Physically Abused: No    Sexually Abused: No   No current facility-administered medications on file prior to encounter.   Current Outpatient Medications on File Prior to Encounter  Medication Sig Dispense Refill   acetaminophen (TYLENOL) 325 MG tablet Take 2 tablets (650 mg total) by mouth every 6 (six) hours as needed (for pain scale < 4).     ibuprofen (ADVIL) 200 MG tablet Take 3 tablets (600 mg total) by mouth every 6 (six) hours as needed.     albuterol (VENTOLIN HFA) 108 (90 Base) MCG/ACT inhaler Inhale 2 puffs into the lungs every 4 (four) hours as needed for wheezing or shortness of breath. 6.7  g 0   diphenhydrAMINE (BENADRYL) 25 MG tablet Take 25 mg by mouth every 6 (six) hours as needed for itching.     fluticasone (FLONASE) 50 MCG/ACT nasal spray Place 1 spray into both nostrils daily for 3 days. 16 g 0   Prenatal Vit-Fe Fumarate-FA (PRENATAL MULTIVITAMIN) TABS tablet Take 1 tablet by mouth daily at 12 noon.     Allergies  Allergen Reactions   Amoxicillin Itching   Latex Itching and Rash    ROS:  Review of Systems  Constitutional:  Negative for chills, fatigue and fever.  Eyes:  Negative for visual disturbance.  Respiratory:  Negative for shortness of breath.   Cardiovascular:  Negative for chest pain.  Gastrointestinal:  Negative for abdominal pain, nausea and vomiting.  Genitourinary:  Negative for difficulty urinating, dysuria, flank pain, pelvic  pain, vaginal bleeding, vaginal discharge and vaginal pain.  Neurological:  Positive for headaches. Negative for dizziness.  Psychiatric/Behavioral: Negative.       I have reviewed patient's Past Medical Hx, Surgical Hx, Family Hx, Social Hx, medications and allergies.   Physical Exam  Patient Vitals for the past 24 hrs:  BP Temp Pulse Resp Height Weight  03/24/22 1745 (!) 137/95 -- 81 -- -- --  03/24/22 1730 (!) 131/96 -- 76 -- -- --  03/24/22 1715 133/89 -- 78 -- -- --  03/24/22 1701 126/89 -- 69 -- -- --  03/24/22 1700 (!) 132/97 -- 77 -- -- --  03/24/22 1646 126/88 -- 80 -- -- --  03/24/22 1630 (!) 125/91 -- 86 -- -- --  03/24/22 1615 129/86 -- 86 -- -- --  03/24/22 1600 125/86 -- 91 -- -- --  03/24/22 1545 126/82 -- 91 -- -- --  03/24/22 1527 125/88 98.5 F (36.9 C) 95 18 5' 2"$  (1.575 m) 54 kg   Constitutional: Well-developed, well-nourished female in no acute distress.  Cardiovascular: normal rate Respiratory: normal effort GI: Abd soft, non-tender. Pos BS x 4 MS: Extremities nontender, no edema, normal ROM Neurologic: Alert and oriented x 4.  GU: Neg CVAT.     LAB RESULTS Results for orders placed or performed during the hospital encounter of 03/24/22 (from the past 24 hour(s))  CBC     Status: Abnormal   Collection Time: 03/24/22  4:25 PM  Result Value Ref Range   WBC 7.2 4.0 - 10.5 K/uL   RBC 3.67 (L) 3.87 - 5.11 MIL/uL   Hemoglobin 11.0 (L) 12.0 - 15.0 g/dL   HCT 31.9 (L) 36.0 - 46.0 %   MCV 86.9 80.0 - 100.0 fL   MCH 30.0 26.0 - 34.0 pg   MCHC 34.5 30.0 - 36.0 g/dL   RDW 11.5 11.5 - 15.5 %   Platelets 390 150 - 400 K/uL   nRBC 0.0 0.0 - 0.2 %  Comprehensive metabolic panel     Status: Abnormal   Collection Time: 03/24/22  4:25 PM  Result Value Ref Range   Sodium 139 135 - 145 mmol/L   Potassium 3.9 3.5 - 5.1 mmol/L   Chloride 103 98 - 111 mmol/L   CO2 24 22 - 32 mmol/L   Glucose, Bld 102 (H) 70 - 99 mg/dL   BUN 9 6 - 20 mg/dL   Creatinine, Ser  0.51 0.44 - 1.00 mg/dL   Calcium 8.9 8.9 - 10.3 mg/dL   Total Protein 6.3 (L) 6.5 - 8.1 g/dL   Albumin 2.7 (L) 3.5 - 5.0 g/dL   AST 30 15 - 41  U/L   ALT 55 (H) 0 - 44 U/L   Alkaline Phosphatase 279 (H) 38 - 126 U/L   Total Bilirubin 0.2 (L) 0.3 - 1.2 mg/dL   GFR, Estimated >60 >60 mL/min   Anion gap 12 5 - 15    --/--/AB NEG (02/10 1156)  IMAGING No results found.  MAU Management/MDM: Orders Placed This Encounter  Procedures   CBC   Comprehensive metabolic panel   Discharge patient    Meds ordered this encounter  Medications   acetaminophen-caffeine (EXCEDRIN TENSION HEADACHE) 500-65 MG per tablet 2 tablet    Pt h/a improved with Excedrin Tension from 7/10 to 3/10.  PEC labs wnl except ALT is 55, which is trending down from elevated liver enzymes due to cholestasis. Consult Dr Mikel Cella with assessment and findings.  D/C home, continue daily procardia, f/u early next week in the office. Reviewed h/a management at home, including Tylenol and ibuprofen doses, and use of caffeine PRN.  Return to MAU with worsening symptoms or emergencies.    ASSESSMENT 1. Postpartum hypertension   2. Acute nonintractable headache, unspecified headache type     PLAN Discharge home Allergies as of 03/24/2022       Reactions   Amoxicillin Itching   Latex Itching, Rash        Medication List     TAKE these medications    acetaminophen 325 MG tablet Commonly known as: Tylenol Take 2 tablets (650 mg total) by mouth every 6 (six) hours as needed (for pain scale < 4).   albuterol 108 (90 Base) MCG/ACT inhaler Commonly known as: VENTOLIN HFA Inhale 2 puffs into the lungs every 4 (four) hours as needed for wheezing or shortness of breath.   diphenhydrAMINE 25 MG tablet Commonly known as: BENADRYL Take 25 mg by mouth every 6 (six) hours as needed for itching.   fluticasone 50 MCG/ACT nasal spray Commonly known as: FLONASE Place 1 spray into both nostrils daily for 3 days.   ibuprofen  200 MG tablet Commonly known as: ADVIL Take 3 tablets (600 mg total) by mouth every 6 (six) hours as needed.   prenatal multivitamin Tabs tablet Take 1 tablet by mouth daily at 12 noon.        Follow-up Information     Sherlyn Hay, DO Follow up.   Specialty: Obstetrics and Gynecology Why: Follow up on Monday 03/28/22 or Tuesday 03/29/22 for blood pressure check in the office. Contact information: [address] [place] [address] [phone]         [facility] Follow up.   Specialty: Obstetrics and Gynecology Why: As needed for emergencies Contact information: [address] [id number] [place] Certified Nurse-Midwife 03/24/2022  5:58 PM

## 2022-03-30 ENCOUNTER — Telehealth (HOSPITAL_COMMUNITY): Payer: Self-pay | Admitting: *Deleted

## 2022-03-30 NOTE — Telephone Encounter (Signed)
Attempted hospital discharge follow-up call. Left message for patient to return RN call with any questions or concerns. Erline Levine, RN, 03/30/22, [phone]

## 2022-05-26 ENCOUNTER — Encounter (HOSPITAL_BASED_OUTPATIENT_CLINIC_OR_DEPARTMENT_OTHER): Payer: Self-pay | Admitting: Obstetrics and Gynecology

## 2022-05-26 NOTE — Progress Notes (Signed)
Spoke w/ via phone for pre-op interview--- pt Lab needs dos----  cbc, urine preg             Lab results------ no COVID test -----patient states asymptomatic no test needed Arrive at ------- 0530 on 06-10-2022 NPO after MN NO Solid Food.  Clear liquids from MN until--- 0430  Med rec completed Medications to take morning of surgery ----- none Diabetic medication ----- n/a Patient instructed no nail polish to be worn day of surgery Patient instructed to bring photo id and insurance card day of surgery Patient aware to have Driver (ride ) / caregiver    for 24 hours after surgery --- sig other, camron Patient Special Instructions ----- n/a Pre-Op special Instructions ----- n/a Patient verbalized understanding of instructions that were given at this phone interview. Patient denies shortness of breath, chest pain, fever, cough at this phone interview.

## 2022-06-08 NOTE — H&P (Signed)
Chelsea [name] is an 22 y.o. female. She is s/p SVD in February, wants permanent sterility, has signed Medicaid BTL papers  Pertinent Gynecological History: Last pap: normal Date: 10/2020 OB History: G2, P1102 SVD x 2   Menstrual History: Patient's last menstrual period was 05/03/2022 (exact date).    Past Medical History:  Diagnosis Date   Anxiety    Headache    History of cholestasis during pregnancy    03/ 2021  and 02/ 2024  both thrid trimester   History of postpartum hypertension 03/24/2022   05-26-2022  per pt postpartum day 5 but has resolved   History of pre-eclampsia 04/2019   Seasonal allergies     Past Surgical History:  Procedure Laterality Date   TONSILLECTOMY     age 71   WISDOM TOOTH EXTRACTION  06/2019    Family History  Problem Relation Age of Onset   Cancer Mother    Asthma Mother    Miscarriages / [place] Mother    Stroke Mother    Hypertension Mother    Migraines Mother    Seizures Mother    Anxiety disorder Mother    Asthma Sister    Miscarriages / [place] Sister    Migraines Sister    Seizures Sister    Anxiety disorder Sister    ADD / ADHD Sister    Depression Sister    Bipolar disorder Sister    Hypertension Maternal Grandmother    Diabetes Maternal Grandmother    Migraines Maternal Grandmother    Depression Maternal Grandmother    Kidney disease Maternal Grandfather    Hypertension Maternal Grandfather    Migraines Maternal Grandfather    Depression Maternal Grandfather    Autism Neg Hx    Schizophrenia Neg Hx     Social History:  reports that she quit smoking about 5 years ago. Her smoking use included cigarettes. She has never used smokeless tobacco. She reports current alcohol use. She reports that she does not use drugs.  Allergies:  Allergies  Allergen Reactions   Amoxicillin Itching and Rash   Latex Itching and Rash    No medications prior to admission.    Review of Systems  Respiratory: Negative.     Cardiovascular: Negative.     Height 5\' 2"  (1.575 m), weight 51.3 kg, last menstrual period 05/03/2022, not currently breastfeeding. Physical Exam Constitutional:      Appearance: Normal appearance.  Cardiovascular:     Rate and Rhythm: Normal rate and regular rhythm.     Heart sounds: Normal heart sounds. No murmur heard. Pulmonary:     Effort: Pulmonary effort is normal. No respiratory distress.     Breath sounds: Normal breath sounds. No wheezing.  Abdominal:     General: There is no distension.     Palpations: Abdomen is soft. There is no mass.     Tenderness: There is no abdominal tenderness.  Genitourinary:    General: Normal vulva.     Comments: Uterus normal No adnexal mass Musculoskeletal:     Cervical back: Normal range of motion and neck supple.  Neurological:     Mental Status: She is alert.     No results found for this or any previous visit (from the past 24 hour(s)).  No results found.  Assessment/Plan: Desires permanent sterility.  Discussed all options for contraception.  Discussed surgical procedure, risks, alternatives, permanency, failure rate, potential for later regret, questions answered.  Will admit for laparoscopic bilateral tubal fulguration  Leighton Roach  Adithi Gammon 06/08/2022, 4:08 PM

## 2022-06-10 ENCOUNTER — Other Ambulatory Visit: Payer: Self-pay

## 2022-06-10 ENCOUNTER — Ambulatory Visit (HOSPITAL_BASED_OUTPATIENT_CLINIC_OR_DEPARTMENT_OTHER): Payer: Medicaid Other | Admitting: Certified Registered"

## 2022-06-10 ENCOUNTER — Encounter (HOSPITAL_BASED_OUTPATIENT_CLINIC_OR_DEPARTMENT_OTHER)
Admission: RE | Disposition: A | Discharge status: Home / Self Care | Payer: Self-pay | Source: Home / Self Care | Attending: Obstetrics and Gynecology

## 2022-06-10 ENCOUNTER — Encounter (HOSPITAL_BASED_OUTPATIENT_CLINIC_OR_DEPARTMENT_OTHER): Payer: Self-pay | Admitting: Obstetrics and Gynecology

## 2022-06-10 ENCOUNTER — Ambulatory Visit (HOSPITAL_BASED_OUTPATIENT_CLINIC_OR_DEPARTMENT_OTHER)
Admission: RE | Admit: 2022-06-10 | Discharge: 2022-06-10 | Disposition: A | Discharge status: Home / Self Care | Payer: Medicaid Other | Attending: Obstetrics and Gynecology | Admitting: Obstetrics and Gynecology

## 2022-06-10 DIAGNOSIS — F419 Anxiety disorder, unspecified: Secondary | ICD-10-CM | POA: Diagnosis not present

## 2022-06-10 DIAGNOSIS — Z302 Encounter for sterilization: Secondary | ICD-10-CM | POA: Insufficient documentation

## 2022-06-10 DIAGNOSIS — Z87891 Personal history of nicotine dependence: Secondary | ICD-10-CM | POA: Diagnosis not present

## 2022-06-10 DIAGNOSIS — Z01818 Encounter for other preprocedural examination: Secondary | ICD-10-CM

## 2022-06-10 HISTORY — PX: LAPAROSCOPIC BILATERAL SALPINGECTOMY: SHX5889

## 2022-06-10 LAB — CBC
HCT: 37.1 % (ref 36.0–46.0)
Hemoglobin: 12 g/dL (ref 12.0–15.0)
MCH: 25.4 pg — ABNORMAL LOW (ref 26.0–34.0)
MCHC: 32.3 g/dL (ref 30.0–36.0)
MCV: 78.6 fL — ABNORMAL LOW (ref 80.0–100.0)
Platelets: 387 10*3/uL (ref 150–400)
RBC: 4.72 MIL/uL (ref 3.87–5.11)
RDW: 12 % (ref 11.5–15.5)
WBC: 7.1 10*3/uL (ref 4.0–10.5)
nRBC: 0 % (ref 0.0–0.2)

## 2022-06-10 LAB — POCT PREGNANCY, URINE: Preg Test, Ur: NEGATIVE

## 2022-06-10 SURGERY — SALPINGECTOMY, BILATERAL, LAPAROSCOPIC
Anesthesia: General | Laterality: Bilateral

## 2022-06-10 MED ORDER — LACTATED RINGERS IV SOLN: INTRAVENOUS | Status: DC

## 2022-06-10 MED ORDER — FENTANYL CITRATE (PF) 100 MCG/2ML IJ SOLN
INTRAMUSCULAR | Status: DC | PRN
  Administered 2022-06-10 (×2): 25 ug via INTRAVENOUS
  Administered 2022-06-10: 50 ug via INTRAVENOUS

## 2022-06-10 MED ORDER — AMISULPRIDE (ANTIEMETIC) 5 MG/2ML IV SOLN: 10.0000 mg | Freq: Once | INTRAVENOUS | Status: DC | PRN

## 2022-06-10 MED ORDER — OXYCODONE HCL 5 MG PO TABS: 5.0000 mg | ORAL_TABLET | Freq: Once | ORAL | Status: DC | PRN

## 2022-06-10 MED ORDER — MIDAZOLAM HCL 2 MG/2ML IJ SOLN
INTRAMUSCULAR | Status: AC
  Filled 2022-06-10: qty 2

## 2022-06-10 MED ORDER — DEXAMETHASONE SODIUM PHOSPHATE 10 MG/ML IJ SOLN
INTRAMUSCULAR | Status: AC
  Filled 2022-06-10: qty 1

## 2022-06-10 MED ORDER — KETOROLAC TROMETHAMINE 30 MG/ML IJ SOLN
INTRAMUSCULAR | Status: DC | PRN
  Administered 2022-06-10: 30 mg via INTRAVENOUS

## 2022-06-10 MED ORDER — HYDROCODONE-ACETAMINOPHEN 5-325 MG PO TABS
1.0000 | ORAL_TABLET | ORAL | 0 refills | Status: AC | PRN
Start: 2022-06-10 — End: ?

## 2022-06-10 MED ORDER — ONDANSETRON HCL 4 MG/2ML IJ SOLN
INTRAMUSCULAR | Status: AC
  Filled 2022-06-10: qty 2

## 2022-06-10 MED ORDER — LIDOCAINE HCL (PF) 2 % IJ SOLN
INTRAMUSCULAR | Status: AC
  Filled 2022-06-10: qty 5

## 2022-06-10 MED ORDER — DEXAMETHASONE SODIUM PHOSPHATE 10 MG/ML IJ SOLN
INTRAMUSCULAR | Status: DC | PRN
  Administered 2022-06-10: 10 mg via INTRAVENOUS

## 2022-06-10 MED ORDER — PROPOFOL 10 MG/ML IV BOLUS
INTRAVENOUS | Status: AC
  Filled 2022-06-10: qty 20

## 2022-06-10 MED ORDER — MEPERIDINE HCL 25 MG/ML IJ SOLN: 6.2500 mg | INTRAMUSCULAR | Status: DC | PRN

## 2022-06-10 MED ORDER — OXYCODONE HCL 5 MG/5ML PO SOLN: 5.0000 mg | Freq: Once | ORAL | Status: DC | PRN

## 2022-06-10 MED ORDER — MIDAZOLAM HCL 5 MG/5ML IJ SOLN
INTRAMUSCULAR | Status: DC | PRN
  Administered 2022-06-10: 2 mg via INTRAVENOUS

## 2022-06-10 MED ORDER — FENTANYL CITRATE (PF) 100 MCG/2ML IJ SOLN
INTRAMUSCULAR | Status: AC
  Filled 2022-06-10: qty 2

## 2022-06-10 MED ORDER — PROMETHAZINE HCL 25 MG/ML IJ SOLN: 6.2500 mg | INTRAMUSCULAR | Status: DC | PRN

## 2022-06-10 MED ORDER — HYDROMORPHONE HCL 1 MG/ML IJ SOLN
0.2500 mg | INTRAMUSCULAR | Status: DC | PRN
  Administered 2022-06-10: 0.5 mg via INTRAVENOUS
  Administered 2022-06-10 (×2): 0.25 mg via INTRAVENOUS

## 2022-06-10 MED ORDER — ROCURONIUM BROMIDE 10 MG/ML (PF) SYRINGE
PREFILLED_SYRINGE | INTRAVENOUS | Status: AC
  Filled 2022-06-10: qty 10

## 2022-06-10 MED ORDER — BUPIVACAINE HCL (PF) 0.25 % IJ SOLN
INTRAMUSCULAR | Status: DC | PRN
  Administered 2022-06-10: 10 mL

## 2022-06-10 MED ORDER — HYDROMORPHONE HCL 1 MG/ML IJ SOLN
INTRAMUSCULAR | Status: AC
  Filled 2022-06-10: qty 1

## 2022-06-10 MED ORDER — ONDANSETRON HCL 4 MG/2ML IJ SOLN
INTRAMUSCULAR | Status: DC | PRN
  Administered 2022-06-10: 4 mg via INTRAVENOUS

## 2022-06-10 MED ORDER — KETOROLAC TROMETHAMINE 30 MG/ML IJ SOLN
INTRAMUSCULAR | Status: AC
  Filled 2022-06-10: qty 1

## 2022-06-10 MED ORDER — ROCURONIUM BROMIDE 10 MG/ML (PF) SYRINGE
PREFILLED_SYRINGE | INTRAVENOUS | Status: DC | PRN
  Administered 2022-06-10: 50 mg via INTRAVENOUS

## 2022-06-10 MED ORDER — PROPOFOL 10 MG/ML IV BOLUS
INTRAVENOUS | Status: DC | PRN
  Administered 2022-06-10: 180 mg via INTRAVENOUS

## 2022-06-10 MED ORDER — LIDOCAINE 2% (20 MG/ML) 5 ML SYRINGE
INTRAMUSCULAR | Status: DC | PRN
  Administered 2022-06-10: 80 mg via INTRAVENOUS

## 2022-06-10 MED ORDER — SUGAMMADEX SODIUM 200 MG/2ML IV SOLN
INTRAVENOUS | Status: DC | PRN
  Administered 2022-06-10 (×2): 100 mg via INTRAVENOUS

## 2022-06-10 SURGICAL SUPPLY — 33 items
ADH SKN CLS APL DERMABOND .7 (GAUZE/BANDAGES/DRESSINGS) ×1
CABLE HIGH FREQUENCY MONO STRZ (ELECTRODE) IMPLANT
CATH ROBINSON RED A/P 16FR (CATHETERS) ×1 IMPLANT
COVER MAYO STAND STRL (DRAPES) ×1 IMPLANT
DERMABOND ADVANCED .7 DNX12 (GAUZE/BANDAGES/DRESSINGS) ×1 IMPLANT
DRAPE SURG IRRIG POUCH 19X23 (DRAPES) ×1 IMPLANT
DURAPREP 26ML APPLICATOR (WOUND CARE) ×1 IMPLANT
GAUZE 4X4 16PLY ~~LOC~~+RFID DBL (SPONGE) IMPLANT
GLOVE BIOGEL PI IND STRL 8 (GLOVE) ×1 IMPLANT
GLOVE ORTHO TXT STRL SZ7.5 (GLOVE) ×1 IMPLANT
GOWN STRL REUS W/TWL XL LVL3 (GOWN DISPOSABLE) ×1 IMPLANT
IRRIG SUCT STRYKERFLOW 2 WTIP (MISCELLANEOUS) ×1
IRRIGATION SUCT STRKRFLW 2 WTP (MISCELLANEOUS) ×1 IMPLANT
KIT PINK PAD W/HEAD ARE REST (MISCELLANEOUS) ×1
KIT PINK PAD W/HEAD ARM REST (MISCELLANEOUS) ×1 IMPLANT
KIT TURNOVER CYSTO (KITS) ×1 IMPLANT
NDL INSUFFLATION 14GA 120MM (NEEDLE) ×1 IMPLANT
NEEDLE INSUFFLATION 14GA 120MM (NEEDLE) ×1 IMPLANT
NS IRRIG 1000ML POUR BTL (IV SOLUTION) ×1 IMPLANT
PACK LAPAROSCOPY BASIN (CUSTOM PROCEDURE TRAY) ×1 IMPLANT
SET TUBE SMOKE EVAC HIGH FLOW (TUBING) ×1 IMPLANT
SHEARS HARMONIC ACE PLUS 36CM (ENDOMECHANICALS) IMPLANT
SLEEVE SCD COMPRESS KNEE MED (STOCKING) ×1 IMPLANT
SLEEVE Z-THREAD 5X100MM (TROCAR) IMPLANT
SUT VIC AB 4-0 PS2 18 (SUTURE) ×1 IMPLANT
SUT VICRYL 0 UR6 27IN ABS (SUTURE) IMPLANT
SYS BAG RETRIEVAL 10MM (BASKET)
SYSTEM BAG RETRIEVAL 10MM (BASKET) IMPLANT
TOWEL OR 17X24 6PK STRL BLUE (TOWEL DISPOSABLE) ×1 IMPLANT
TROCAR BALLN 12MMX100 BLUNT (TROCAR) IMPLANT
TROCAR Z-THREAD FIOS 11X100 BL (TROCAR) IMPLANT
TROCAR Z-THREAD FIOS 5X100MM (TROCAR) ×1 IMPLANT
WARMER LAPAROSCOPE (MISCELLANEOUS) ×1 IMPLANT

## 2022-06-10 NOTE — Discharge Instructions (Addendum)
Routine instructions for laparoscopy      DISCHARGE INSTRUCTIONS: Laparoscopy  The following instructions have been prepared to help you care for yourself upon your return home today.  Wound care:  Do not get the incision wet for the first 24 hours. The incision should be kept clean and dry.  The Band-Aids or dressings may be removed the day after surgery.  Should the incision become sore, red, and swollen after the first week, check with your doctor.  Personal hygiene:  Shower the day after your procedure.  Activity and limitations:  Do NOT drive or operate any equipment today.  Do NOT lift anything more than 15 pounds for 2-3 weeks after surgery.  Do NOT rest in bed all day.  Walking is encouraged. Walk each day, starting slowly with 5-minute walks 3 or 4 times a day. Slowly increase the length of your walks.  Walk up and down stairs slowly.  Do NOT do strenuous activities, such as golfing, playing tennis, bowling, running, biking, weight lifting, gardening, mowing, or vacuuming for 2-4 weeks. Ask your doctor when it is okay to start.  Diet: Eat a light meal as desired this evening. You may resume your usual diet tomorrow.  Return to work: This is dependent on the type of work you do. For the most part you can return to a desk job within a week of surgery. If you are more active at work, please discuss this with your doctor.  What to expect after your surgery: You may have a slight burning sensation when you urinate on the first day. You may have a very small amount of blood in the urine. Expect to have a small amount of vaginal discharge/light bleeding for 1-2 weeks. It is not unusual to have abdominal soreness and bruising for up to 2 weeks. You may be tired and need more rest for about 1 week. You may experience shoulder pain for 24-72 hours. Lying flat in bed may relieve it.  Call your doctor for any of the following:  Develop a fever of 100.4 or greater  Inability to urinate  6 hours after discharge from hospital  Severe pain not relieved by pain medications  Persistent of heavy bleeding at incision site  Redness or swelling around incision site after a week  Increasing nausea or vomiting      No ibuprofen, Advil, Aleve, Motrin, ketorolac, meloxicam, naproxen, or other NSAIDS until after 2:30 pm today if needed.          Post Anesthesia Home Care Instructions  Activity: Get plenty of rest for the remainder of the day. A responsible individual must stay with you for 24 hours following the procedure.  For the next 24 hours, DO NOT: -Drive a car -Advertising copywriter -Drink alcoholic beverages -Take any medication unless instructed by your physician -Make any legal decisions or sign important papers.  Meals: Start with liquid foods such as gelatin or soup. Progress to regular foods as tolerated. Avoid greasy, spicy, heavy foods. If nausea and/or vomiting occur, drink only clear liquids until the nausea and/or vomiting subsides. Call your physician if vomiting continues.  Special Instructions/Symptoms: Your throat may feel dry or sore from the anesthesia or the breathing tube placed in your throat during surgery. If this causes discomfort, gargle with warm salt water. The discomfort should disappear within 24 hours.  If you had a scopolamine patch placed behind your ear for the management of post- operative nausea and/or vomiting:  1. The medication in the  patch is effective for 72 hours, after which it should be removed.  Wrap patch in a tissue and discard in the trash. Wash hands thoroughly with soap and water. 2. You may remove the patch earlier than 72 hours if you experience unpleasant side effects which may include dry mouth, dizziness or visual disturbances. 3. Avoid touching the patch. Wash your hands with soap and water after contact with the patch.

## 2022-06-10 NOTE — Transfer of Care (Signed)
Immediate Anesthesia Transfer of Care Note  Patient: Chelsea [name]  Procedure(s) Performed: LAPAROSCOPIC BILATERAL SALPINGECTOMY (Bilateral)  Patient Location: PACU  Anesthesia Type:General  Level of Consciousness: awake, alert , and oriented  Airway & Oxygen Therapy: Patient Spontanous Breathing and Patient connected to nasal cannula oxygen  Post-op Assessment: Report given to RN and Post -op Vital signs reviewed and stable  Post vital signs: Reviewed and stable  Last Vitals:  Vitals Value Taken Time  BP 115/100 06/10/22 0830  Temp    Pulse 75 06/10/22 0830  Resp 15 06/10/22 0830  SpO2 100 % 06/10/22 0830  Vitals shown include unvalidated device data.  Last Pain:  Vitals:   06/10/22 0559  TempSrc: Oral  PainSc: 0-No pain      Patients Stated Pain Goal: 5 (06/10/22 0559)  Complications: No notable events documented.

## 2022-06-10 NOTE — Anesthesia Procedure Notes (Signed)
Procedure Name: Intubation Date/Time: 06/10/2022 7:38 AM  Performed by: Landyn Buckalew D, CRNAPre-anesthesia Checklist: Patient identified, Emergency Drugs available, Suction available and Patient being monitored Patient Re-evaluated:Patient Re-evaluated prior to induction Oxygen Delivery Method: Circle system utilized Preoxygenation: Pre-oxygenation with 100% oxygen Induction Type: IV induction Ventilation: Mask ventilation without difficulty Laryngoscope Size: Mac and 3 Grade View: Grade I Tube type: Oral Tube size: 7.0 mm Number of attempts: 1 Airway Equipment and Method: Stylet and Oral airway Placement Confirmation: ETT inserted through vocal cords under direct vision, positive ETCO2 and breath sounds checked- equal and bilateral Secured at: 21 cm Tube secured with: Tape Dental Injury: Teeth and Oropharynx as per pre-operative assessment

## 2022-06-10 NOTE — Op Note (Signed)
Preoperative diagnosis: Desires surgical sterility Postoperative diagnosis: Same Procedure: Laparoscopic bilateral tubal fulguration Surgeon: Lavina Hamman M.D. Anesthesia: Gen. Endotracheal tube Findings: She had a normal pelvis, normal uterus, tubes and ovaries Estimated blood loss: Minimal Complications: None  Procedure in detail: The patient was taken to the operating room and placed in the dorsosupine position. General anesthesia was induced. Her left arm was tucked to her side and legs were placed in mobile stirrups. Abdomen was then prepped and draped in the usual sterile fashion, bladder drained with a red Robinson catheter, Hulka tenaculum applied to the cervix for uterine manipulation. Infraumbilical skin was then infiltrated with quarter percent Marcaine and a 1 cm horizontal incision was made. The Veress needle was inserted into the peritoneal cavity and placement confirmed by the water drop test an opening pressure of 2 mm of mercury. CO2 was insufflated to a pressure of 14 mm of mercury and the Veress needle was removed. A 10/11 disposable trocar was then introduced with direct visualization with the laparoscope. The operating scope was then inserted. Good visualization was achieved, inspection revealed the above-mentioned findings. Both fallopian tubes were easily identified and traced to their fimbriated ends. A 3 cm portion of the middle of each tube was fulgurated with bipolar cautery until the amp meter read 0 in all spots along a 3 cm segment. This was done on both sides with good fulguration of both tubes and good hemostasis. No complications were encountered. The laparoscope was removed. All gas was allowed to deflate from the abdomen and the trocar was removed. Fascia was approximated with one suture of 0 Vicryl. Skin incision was closed with interrupted subcuticular sutures of 4-0 Vicryl followed by Dermabond. The Hulka tenaculum was removed. The patient was awakened in the  operating room and taken to the recovery in stable condition after tolerating the procedure well. Counts were correct and she had PAS hose on throughout the procedure.

## 2022-06-10 NOTE — Anesthesia Postprocedure Evaluation (Signed)
Anesthesia Post Note  Patient: Chelsea [name]  Procedure(s) Performed: LAPAROSCOPIC BILATERAL SALPINGECTOMY (Bilateral)     Patient location during evaluation: PACU Anesthesia Type: General Level of consciousness: awake and alert Pain management: pain level controlled Vital Signs Assessment: post-procedure vital signs reviewed and stable Respiratory status: spontaneous breathing, nonlabored ventilation and respiratory function stable Cardiovascular status: blood pressure returned to baseline and stable Postop Assessment: no apparent nausea or vomiting Anesthetic complications: no   No notable events documented.  Last Vitals:  Vitals:   06/10/22 0915 06/10/22 0930  BP: 111/63 118/67  Pulse: 63 69  Resp: 14 16  Temp:  36.7 C  SpO2: 100% 100%    Last Pain:  Vitals:   06/10/22 0915  TempSrc:   PainSc: 4                  Lowella Curb

## 2022-06-10 NOTE — Interval H&P Note (Signed)
History and Physical Interval Note:  06/10/2022 7:16 AM  Chelsea [name]  has presented today for surgery, with the diagnosis of sterilization Z30.2.  The various methods of treatment have been discussed with the patient and family. After consideration of risks, benefits and other options for treatment, the patient has consented to  Procedure(s): LAPAROSCOPIC BILATERAL SALPINGECTOMY (Bilateral) as a surgical intervention.  The patient's history has been reviewed, patient examined, no change in status, stable for surgery.  I have reviewed the patient's chart and labs.  Questions were answered to the patient's satisfaction.     Leighton Roach Anora Schwenke

## 2022-06-10 NOTE — Anesthesia Preprocedure Evaluation (Signed)
Anesthesia Evaluation  Patient identified by MRN, date of birth, ID band Patient awake    Reviewed: Allergy & Precautions, H&P , NPO status , Patient's Chart, lab work & pertinent test results  Airway Mallampati: II       Dental no notable dental hx.    Pulmonary neg pulmonary ROS, Patient abstained from smoking., former smoker   Pulmonary exam normal        Cardiovascular negative cardio ROS Normal cardiovascular exam     Neuro/Psych  Headaches  Anxiety     negative neurological ROS  negative psych ROS   GI/Hepatic negative GI ROS, Neg liver ROS,,,  Endo/Other  negative endocrine ROS    Renal/GU negative Renal ROS  negative genitourinary   Musculoskeletal negative musculoskeletal ROS (+)    Abdominal Normal abdominal exam  (+)   Peds negative pediatric ROS (+)  Hematology negative hematology ROS (+)   Anesthesia Other Findings   Reproductive/Obstetrics negative OB ROS                             Anesthesia Physical Anesthesia Plan  ASA: 2  Anesthesia Plan: General   Post-op Pain Management: Dilaudid IV   Induction: Intravenous  PONV Risk Score and Plan: 3 and Ondansetron, Dexamethasone, Midazolam and Treatment may vary due to age or medical condition  Airway Management Planned: Oral ETT  Additional Equipment:   Intra-op Plan:   Post-operative Plan: Extubation in OR  Informed Consent: I have reviewed the patients History and Physical, chart, labs and discussed the procedure including the risks, benefits and alternatives for the proposed anesthesia with the patient or authorized representative who has indicated his/her understanding and acceptance.     Dental advisory given  Plan Discussed with: Anesthesiologist  Anesthesia Plan Comments:         Anesthesia Quick Evaluation

## 2022-06-13 ENCOUNTER — Encounter (HOSPITAL_BASED_OUTPATIENT_CLINIC_OR_DEPARTMENT_OTHER): Payer: Self-pay | Admitting: Obstetrics and Gynecology

## 2022-07-25 ENCOUNTER — Ambulatory Visit
Admission: EM | Admit: 2022-07-25 | Discharge: 2022-07-25 | Discharge status: Against Medical Advice | Payer: Medicaid Other

## 2022-07-25 NOTE — ED Notes (Signed)
Pt called to come to exam room -- pt states she had to leave and get home to her newborn.

## 2022-08-07 ENCOUNTER — Telehealth: Payer: Medicaid Other | Admitting: Family Medicine

## 2022-08-07 DIAGNOSIS — J019 Acute sinusitis, unspecified: Secondary | ICD-10-CM

## 2022-08-07 MED ORDER — DOXYCYCLINE HYCLATE 100 MG PO TABS: 100.0000 mg | ORAL_TABLET | Freq: Two times a day (BID) | ORAL | 0 refills | Status: AC

## 2022-08-07 NOTE — Progress Notes (Signed)

## 2022-11-10 ENCOUNTER — Telehealth: Payer: Medicaid Other | Admitting: Physician Assistant

## 2022-11-10 ENCOUNTER — Encounter: Payer: Self-pay | Admitting: Physician Assistant

## 2022-11-10 DIAGNOSIS — K120 Recurrent oral aphthae: Secondary | ICD-10-CM

## 2022-11-10 MED ORDER — CHLORHEXIDINE GLUCONATE 0.12 % MT SOLN
15.0000 mL | Freq: Every day | OROMUCOSAL | 0 refills | Status: AC
Start: 2022-11-10 — End: 2022-11-13

## 2022-11-10 NOTE — Progress Notes (Signed)
Virtual Visit Consent   Chelsea [name], you are scheduled for a virtual visit with a [facility] provider today. Just as with appointments in the office, your consent must be obtained to participate. Your consent will be active for this visit and any virtual visit you may have with one of our providers in the next 365 days. If you have a MyChart account, a copy of this consent can be sent to you electronically.  As this is a virtual visit, video technology does not allow for your provider to perform a traditional examination. This may limit your provider's ability to fully assess your condition. If your provider identifies any concerns that need to be evaluated in person or the need to arrange testing (such as labs, EKG, etc.), we will make arrangements to do so. Although advances in technology are sophisticated, we cannot ensure that it will always work on either your end or our end. If the connection with a video visit is poor, the visit may have to be switched to a telephone visit. With either a video or telephone visit, we are not always able to ensure that we have a secure connection.  By engaging in this virtual visit, you consent to the provision of healthcare and authorize for your insurance to be billed (if applicable) for the services provided during this visit. Depending on your insurance coverage, you may receive a charge related to this service.  I need to obtain your verbal consent now. Are you willing to proceed with your visit today? Chelsea [name] has provided verbal consent on 11/10/2022 for a virtual visit (video or telephone). Chelsea [name], [place]  Date: 11/10/2022 2:59 PM  Virtual Visit via Video Note   I, Chelsea [name], connected with  Chelsea [name]  ([id number], 05-23-2000) on 11/10/22 at  2:30 PM EDT by a video-enabled telemedicine application and verified that I am speaking with the correct person using two identifiers.  Location: Patient: Virtual Visit Location  Patient: Home Provider: Virtual Visit Location Provider: Home Office   I discussed the limitations of evaluation and management by telemedicine and the availability of in person appointments. The patient expressed understanding and agreed to proceed.    History of Present Illness: Chelsea [name] is a 22 y.o. who identifies as a female who was assigned female at birth, and is being seen today for sores in her mouth first noted about 2.5 days ago. Notes 3 total, one on inside of each cheek and the largest adjacent to where her wisdom tooth was removed, o the inner gumline. Notes areas are irritated. Denies any atypical foods for her.  Denies sore throat or URI symptoms. Notes spicy foods prior to onset. Does also vape.  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  Resting comfortably at home.  Head is normocephalic, atraumatic.  No labored breathing. Speech is clear and coherent with logical content.  Patient is alert and oriented at baseline.    .  Assessment and Plan: 1. Aphthous ulcer - chlorhexidine (PERIDEX) 0.12 % solution; Use as directed 15 mLs in the mouth or throat daily for 3 days.  Dispense: 45 mL; Refill: 0  Supportive measures and OTC medications reviewed. Start Biotene rinse and multivitamin. Avoid trigger foods -- list given. Peridex per orders.  Follow Up Instructions: I discussed the assessment and treatment plan with the patient. The patient was provided an opportunity to ask questions and all were answered. The patient agreed with the plan and demonstrated an understanding of the instructions.  A copy of instructions were sent to the patient via MyChart unless otherwise noted below.   The patient was advised to call back or seek an in-person evaluation if the symptoms worsen or if the condition fails to improve as anticipated.  Time:  I spent 10 minutes with the patient via telehealth technology discussing the above problems/concerns.    Chelsea Climes, PA-C

## 2022-11-10 NOTE — Patient Instructions (Signed)
Sergio S Poullard, thank you for joining Piedad Climes, PA-C for today's virtual visit.  While this provider is not your primary care provider (PCP), if your PCP is located in our provider database this encounter information will be shared with them immediately following your visit.   A [facility] MyChart account gives you access to today's visit and all your visits, tests, and labs performed at [facility] " click here if you don't have a [facility] MyChart account or go to [email or web address]  Consent: (Patient) Chelsea [name] provided verbal consent for this virtual visit at the beginning of the encounter.  Current Medications:  Current Outpatient Medications:    acetaminophen (TYLENOL) 500 MG tablet, Take 500 mg by mouth every 6 (six) hours as needed for headache., Disp: , Rfl:    HYDROcodone-acetaminophen (NORCO/VICODIN) 5-325 MG tablet, Take 1 tablet by mouth every 4 (four) hours as needed for severe pain., Disp: 10 tablet, Rfl: 0   ibuprofen (ADVIL) 200 MG tablet, Take 3 tablets (600 mg total) by mouth every 6 (six) hours as needed. (Patient taking differently: Take 600 mg by mouth every 6 (six) hours as needed for fever or headache.), Disp: , Rfl:    Medications ordered in this encounter:  No orders of the defined types were placed in this encounter.    *If you need refills on other medications prior to your next appointment, please contact your pharmacy*  Follow-Up: Call back or seek an in-person evaluation if the symptoms worsen or if the condition fails to improve as anticipated.  [facility] [phone]  Other Instructions E-Visit for Mouth Ulcers  We are sorry that you are not feeling well.  Here is how we plan to help!  Based on what you have shared with me, it appears that you do have mouth ulcer(s).     The following medications should decrease the discomfort and help with healing. Biotene mouthwash 3 times daily (Available over  the counter), Multivitamin daily, and Chlorhexidine mouthwash daily for 3 days   Mouth ulcers are painful areas in the mouth and gums. These are also known as "canker sores".  They can occur anywhere inside the mouth. While mostly harmless, mouth ulcers can be extremely uncomfortable and may make it difficult to eat, drink, and brush your teeth.  You may have more than 1 ulcer and they can vary and change in size. Mouth ulcers are not contagious and should not be confused with cold sores.  Cold sores appear on the lip or around the outside of the mouth and often begin with a tingling, burning or itching sensation.   While the exact causes are unknown, some common causes and factors that may aggravate mouth ulcers include: Genetics - Sometimes mouth ulcers run in families High alcohol intake Acidic foods such as citrus fruits like pineapple, grapefruit, orange fruits/juices, may aggravate mouth ulcers Other foods high in acidity or spice such as coffee, chocolate, chips, pretzels, eggs, nuts, cheese Quitting smoking Injury caused by biting the tongue or inside of the cheek Diet lacking in B-12, zinc, folic acid or iron Female hormone shifts with menstruation Excessive fatigue, emotional stress or anxiety Prevention: Talk to your doctor if you are taking meds that are known to cause mouth ulcers such as:   Anti-inflammatory drugs (for example Ibuprofen, Naproxen sodium), pain killers, Beta blockers, Oral nicotine replacement drugs, Some street drugs (heroin).   Avoid allowing any tablets to dissolve in your mouth that are meant to  swallowed whole Avoid foods/drinks that trigger or worsen symptoms Keep your mouth clean with daily brushing and flossing  Home Care: The goal with treatment is to ease the pain where ulcers occur and help them heal as quickly as possible.  There is no medical treatment to prevent mouth ulcers from coming back or recurring.  Avoid spicy and acidic foods Eat soft foods  and avoid rough, crunchy foods Avoid chewing gum Do not use toothpaste that contains sodium lauryl sulphite Use a straw to drink which helps avoid liquids toughing the ulcers near the front of your mouth Use a very soft toothbrush If you have dentures or dental hardware that you feel is not fitting well or contributing to his, please see your dentist. Use saltwater mouthwash which helps healing. Dissolve a  teaspoon of salt in a glass of warm water. Swish around your mouth and spit it out. This can be used as needed if it is soothing.   GET HELP RIGHT AWAY IF: Persistent ulcers require checking IN PERSON (face to face). Any mouth lesion lasting longer than a month should be seen by your DENTIST as soon as possible for evaluation for possible oral cancer. If you have a non-painful ulcer in 1 or more areas of your mouth Ulcers that are spreading, are very large or particularly painful Ulcers last longer than one week without improving on treatment If you develop a fever, swollen glands and begin to feel unwell Ulcers that developed after starting a new medication MAKE SURE YOU: Understand these instructions. Will watch your condition. Will get help right away if you are not doing well or get worse.    If you have been instructed to have an in-person evaluation today at a local Urgent Care facility, please use the link below. It will take you to a list of all of our available [facility], including address, phone number and hours of operation. Please do not delay care.  [facility]  If you or a family member do not have a primary care provider, use the link below to schedule a visit and establish care. When you choose a [facility] primary care physician or advanced practice provider, you gain a long-term partner in health. Find a Primary Care Provider  Learn more about [facility] in-office and virtual care options:  - Get Care Now

## 2023-04-12 ENCOUNTER — Other Ambulatory Visit (HOSPITAL_BASED_OUTPATIENT_CLINIC_OR_DEPARTMENT_OTHER): Payer: Self-pay | Admitting: Family Medicine

## 2023-04-12 DIAGNOSIS — N921 Excessive and frequent menstruation with irregular cycle: Secondary | ICD-10-CM

## 2023-04-12 DIAGNOSIS — G8929 Other chronic pain: Secondary | ICD-10-CM

## 2023-04-14 ENCOUNTER — Ambulatory Visit (HOSPITAL_BASED_OUTPATIENT_CLINIC_OR_DEPARTMENT_OTHER)
Admission: RE | Admit: 2023-04-14 | Discharge: 2023-04-14 | Disposition: A | Discharge status: Home / Self Care | Source: Ambulatory Visit | Attending: Family Medicine | Admitting: Family Medicine

## 2023-04-14 DIAGNOSIS — N921 Excessive and frequent menstruation with irregular cycle: Secondary | ICD-10-CM | POA: Diagnosis not present

## 2023-04-14 DIAGNOSIS — G8929 Other chronic pain: Secondary | ICD-10-CM

## 2023-04-14 DIAGNOSIS — R102 Pelvic and perineal pain: Secondary | ICD-10-CM | POA: Diagnosis not present

## 2023-07-23 ENCOUNTER — Telehealth: Admitting: Family Medicine

## 2023-07-23 DIAGNOSIS — T63444A Toxic effect of venom of bees, undetermined, initial encounter: Secondary | ICD-10-CM

## 2023-07-23 MED ORDER — PREDNISONE 20 MG PO TABS: 20.0000 mg | ORAL_TABLET | Freq: Two times a day (BID) | ORAL | 0 refills | Status: AC

## 2023-07-23 MED ORDER — DOXYCYCLINE HYCLATE 100 MG PO TABS
100.0000 mg | ORAL_TABLET | Freq: Two times a day (BID) | ORAL | 0 refills | Status: AC
Start: 2023-07-23 — End: 2023-07-30

## 2023-07-23 NOTE — Patient Instructions (Signed)
Bee, Wasp, or [employer], Adult Bees, wasps, and hornets are part of a family of insects that sting. Normally, a sting will cause pain, redness, and swelling at the sting site. However, some people have an allergy to these stings, and their reactions can be much more serious. What increases the risk? You may be at a greater risk of getting stung if you: Provoke a stinging insect by swatting or disturbing it. Wear strong-smelling soaps, deodorants, or body sprays. Spend time outdoors near gardens with flowers or fruit trees or in clothes that expose skin. Eat or drink outside. What are the signs or symptoms? The reaction to an insect sting can vary from a mild, normal response to life-threatening anaphylaxis. The sting site is often a red lump in the skin, sometimes with a tiny hole in the center, that may still have the stinger in the center of the wound. Normal reaction A normal reaction is experienced by most people after an insect sting. Symptoms include: Pain, redness, and swelling at the sting site. These can develop over 24-48 hours. Pain, redness, and swelling that may spread to a larger, connected area beyond the sting site. The spreading can continue over 24-48 hours. Allergic reaction An allergic reaction can vary in severity and includes symptoms in other areas of the body beyond the sting site. People who experience an allergic reaction have a higher risk of having similar or worse symptoms the next time they are stung. Symptoms may include: Hives, itching, and swelling. Abdominal symptoms including cramping, nausea, vomiting, and diarrhea. Severe symptoms that require immediate medical attention include: Chest pain or tightness. Wheezing or trouble breathing. Swelling of the tongue, throat, or lips. Trouble swallowing or hoarse voice. Anaphylactic reaction An anaphylactic reaction is a severe, life-threatening allergy and requires immediate medical attention. The symptoms often  include severe allergic reaction symptoms that develop rapidly and lead to: A sudden and sharp drop in blood pressure. Dizziness. Loss of consciousness. How is this diagnosed? This condition is usually diagnosed based on your symptoms and medical history as well as a physical exam. You may have an allergy test to determine if you are allergic to the insect venom. How is this treated? If you were stung by a bee, the stinger and a small sac of venom may be in the wound. Remove the stinger as soon as possible. Do this by brushing across the wound with gauze, a clean fingernail, or a flat card such as a credit card. This can help reduce the severity of your body's reaction to the sting. Normal reactions can be treated with: Washing the area thoroughly with soap and water. Applying ice to the area to reduce swelling. Oral or topical medicines to help reduce pain and itching, if present. Pay close attention to your symptoms after you have been stung. If possible, have someone stay with you to see if an allergic reaction develops. If allergic symptoms develop, oral antihistamines can be taken and you will need medical help right away. If you had an allergic reaction before, you may need to: Use an auto-injector "pen"(pre-filled automatic epinephrine injection device)at the first sign of an allergic reaction. Get medical help right away because the epinephrine is short-acting. It is intended to give you more time to get to an emergency room. Follow these instructions at home:  Wash the sting site 2-3 times a day with soap and water as told by your health care provider. Apply or take over-the-counter and prescription medicines only as told  by your health care provider. If directed, apply ice to the sting area. Put ice in a plastic bag. Place a towel between your skin and the bag. Leave the ice on for 20 minutes, 2-3 times a day. Do not scratch the sting area. If you had a severe allergic reaction to  a sting, you may need to: Wear a medical bracelet or necklace that lists the allergy. Carry an anaphylaxis kit or an epinephrine auto-injector "pen" with you at all times. Tell your family members, friends, and coworkers when and how to use it. Use it at the first sign of an allergic reaction. How is this prevented? Avoid swatting at stinging insects and disturbing insect nests. Do not use fragrant soaps or lotions and avoid sitting near flowering plants, if possible. Wear shoes, pants, and long sleeves when spending time outdoors, especially in grassy areas where stinging insects are common. Keep outdoor areas free from nests or hives. Keep food and drink containers covered when eating outdoors. Wear gloves if you are gardening or working outdoors. Find a barrier between you and the insect(s), such as a door, if an attack by a stinging insect or a swarm seems likely. Contact a health care provider if: Your symptoms do not get better in 2-3 days. You have redness, swelling, or pain that spreads beyond the area of the sting. You have a fever. Get help right away if: You have symptoms of a severe allergic reaction. These include: Chest tightness or pain. Wheezing, or trouble swallowing or breathing. Light-headedness, dizziness, or fainting. Itchy, raised, red patches on the skin beyond the sting site. Abdominal cramping, nausea, vomiting, or diarrhea. Trouble swallowing or a swollen tongue, throat, or lips. These symptoms may be an emergency. Get help right away. Call 911. Do not wait to see if the symptoms will go away. Do not drive yourself to the hospital. Summary Stings from bees, wasps, and hornets can cause pain and swelling, but they are usually not serious. However, some people may have an allergic reaction to a sting. This can cause the symptoms to be more severe. Pay close attention to your symptoms after you have been stung. If possible, have someone stay with you to look for  signs of worsening symptoms. Call your health care provider if you have any signs of an allergic reaction. This information is not intended to replace advice given to you by your health care provider. Make sure you discuss any questions you have with your health care provider. Document Revised: 03/23/2021 Document Reviewed: 03/23/2021 Elsevier Patient Education  2024 [employer].

## 2023-07-23 NOTE — Progress Notes (Signed)
 Virtual Visit Consent   Kataleyah Carducci Niznik, you are scheduled for a virtual visit with a [facility] provider today. Just as with appointments in the office, your consent must be obtained to participate. Your consent will be active for this visit and any virtual visit you may have with one of our providers in the next 365 days. If you have a MyChart account, a copy of this consent can be sent to you electronically.  As this is a virtual visit, video technology does not allow for your provider to perform a traditional examination. This may limit your provider's ability to fully assess your condition. If your provider identifies any concerns that need to be evaluated in person or the need to arrange testing (such as labs, EKG, etc.), we will make arrangements to do so. Although advances in technology are sophisticated, we cannot ensure that it will always work on either your end or our end. If the connection with a video visit is poor, the visit may have to be switched to a telephone visit. With either a video or telephone visit, we are not always able to ensure that we have a secure connection.  By engaging in this virtual visit, you consent to the provision of healthcare and authorize for your insurance to be billed (if applicable) for the services provided during this visit. Depending on your insurance coverage, you may receive a charge related to this service.  I need to obtain your verbal consent now. Are you willing to proceed with your visit today? Joslin Doell Helget has provided verbal consent on 07/23/2023 for a virtual visit (video or telephone). Albertha Huger, FNP  Date: 07/23/2023 7:31 PM   Virtual Visit via Video Note   I, Albertha Huger, connected with  TAYLR MEUTH  ([id number], [birth year]) on 07/23/23 at  7:30 PM EDT by a video-enabled telemedicine application and verified that I am speaking with the correct person using two identifiers.  Location: Patient: Virtual Visit Location Patient:  Home Provider: Virtual Visit Location Provider: Home Office   I discussed the limitations of evaluation and management by telemedicine and the availability of in person appointments. The patient expressed understanding and agreed to proceed.    History of Present Illness: AUNESTI PELLEGRINO is a 23 y.o. who identifies as a female who was assigned female at birth, and is being seen today for a bee sting to top of left foot 3 days ago that redness has spread on. It is warm and painful. No stinger noted.   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   Allergic rhinitis 10/29/2014   Nonallergic rhinitis 10/29/2014   Medication overuse headache 10/29/2014   Hypoglycemia, unspecified 07/22/2010    Allergies:  Allergies  Allergen Reactions   Amoxicillin Itching and Rash   Latex Itching and Rash   Medications:  Current Outpatient Medications:    doxycycline  (VIBRA -TABS) 100 MG tablet, Take 1 tablet (100 mg total) by mouth 2 (two) times daily for 7 days., Disp: 14 tablet, Rfl: 0   predniSONE (DELTASONE) 20 MG tablet, Take 1 tablet (20 mg total)  by mouth 2 (two) times daily with a meal for 5 days., Disp: 10 tablet, Rfl: 0  Observations/Objective: Patient is well-developed, well-nourished in no acute distress.  Resting comfortably at home.  Head is normocephalic, atraumatic.  No labored breathing.  Speech is clear and coherent with logical content.  Patient is alert and oriented at baseline.    Assessment and Plan: 1. Bee sting, undetermined intent, initial encounter  (Primary)  Cool compresses, UC as needed.   Follow Up Instructions: I discussed the assessment and treatment plan with the patient. The patient was provided an opportunity to ask questions and all were answered. The patient agreed with the plan and demonstrated an understanding of the instructions.  A copy of instructions were sent to the patient via MyChart unless otherwise noted below.     The patient was advised to call back or seek an in-person evaluation if the symptoms worsen or if the condition fails to improve as anticipated.    Lenna Hagarty, FNP

## 2023-10-15 ENCOUNTER — Ambulatory Visit: Admission: EM | Admit: 2023-10-15 | Discharge: 2023-10-15 | Disposition: A | Discharge status: Home / Self Care

## 2023-10-15 DIAGNOSIS — U071 COVID-19: Secondary | ICD-10-CM

## 2023-10-15 LAB — POC SOFIA SARS ANTIGEN FIA: SARS Coronavirus 2 Ag: POSITIVE — AB

## 2023-10-15 MED ORDER — FLUTICASONE PROPIONATE 50 MCG/ACT NA SUSP: 2.0000 | Freq: Every day | NASAL | 0 refills | Status: AC

## 2023-10-15 MED ORDER — MUCINEX DM MAXIMUM STRENGTH 60-1200 MG PO TB12: 1.0000 | ORAL_TABLET | Freq: Two times a day (BID) | ORAL | 0 refills | Status: AC

## 2023-10-15 MED ORDER — PSEUDOEPH-BROMPHEN-DM 30-2-10 MG/5ML PO SYRP: 10.0000 mL | ORAL_SOLUTION | Freq: Four times a day (QID) | ORAL | 0 refills | Status: AC | PRN

## 2023-10-15 NOTE — ED Triage Notes (Signed)
 Pt reports headache, sore throat, sinus pressure, cough congestion started Thursday

## 2023-10-15 NOTE — ED Provider Notes (Signed)
 [facility]    CSN: [id number] Arrival date & time: 10/15/23  9165      History   Chief Complaint No chief complaint on file.   HPI Chelsea [name] is a 23 y.o. female.   Discussed the use of AI scribe software for clinical note transcription with the patient, who gave verbal consent to proceed.   Patient presents with multiple symptoms of an upper respiratory illness that began on about 3 days ago. She reports being the last in her household to become ill after her son's father's family visited, claiming they had allergies but also had fevers and other symptoms that she thinks does not align with typical allergies.   Her primary complaints include a severely irritated throat, loss of voice, nasal drainage, body aches, and a headache. She describes significant pressure in her sinuses, stating that blowing her nose provides no relief. She also reports coughing but denies any wheezing or shortness of breath. She mentions having nasal congestion as well. She also has had some chills and subjective fevers. She denies any nausea, vomiting, or diarrhea. To manage her symptoms,she has been taking over-the-counter medication. The illness appears to be affecting multiple family members, with patient noting that they were all sick. She vapes but hasn't in several days due to her symptoms. She is here today with her 50 month old son who is also being evaluated for similar symptoms.   The following portions of the patient's history were reviewed and updated as appropriate: allergies, current medications, past family history, past medical history, past social history, past surgical history, and problem list.    Past Medical History:  Diagnosis Date   Anxiety    Headache    History of cholestasis during pregnancy    03/ 2021  and 02/ 2024  both thrid trimester   History of postpartum hypertension 03/24/2022   05-26-2022  per pt postpartum day 5 but has resolved   History of  pre-eclampsia 04/2019   Seasonal allergies     Patient Active Problem List   Diagnosis Date Noted   Cholestasis of pregnancy in third trimester 03/19/2022   SVD (spontaneous vaginal delivery) 04/26/2019   Cholestasis during pregnancy in third trimester 04/26/2019   Indication for care in labor or delivery 04/25/2019   Migraine without aura and without status migrainosus, not intractable 03/08/2017   Tension headache 03/08/2017   Anxiety state 03/08/2017   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   Allergic rhinitis 10/29/2014   Nonallergic rhinitis 10/29/2014   Medication overuse headache 10/29/2014   Hypoglycemia, unspecified 07/22/2010    Past Surgical History:  Procedure Laterality Date   LAPAROSCOPIC BILATERAL SALPINGECTOMY Bilateral 06/10/2022   Procedure: LAPAROSCOPIC BILATERAL SALPINGECTOMY;  Surgeon: Horacio Boas, MD;  Location: [facility];  Service: Gynecology;  Laterality: Bilateral;   TONSILLECTOMY     age 47   WISDOM TOOTH EXTRACTION  06/2019    OB History     Gravida  2   Para  2   Term  1   Preterm  1   AB      Living  2      SAB      IAB      Ectopic      Multiple  0   Live Births  2            Home Medications    Prior to Admission medications   Medication Sig Start Date End Date Taking? Authorizing Provider  brompheniramine-pseudoephedrine-DM  30-2-10 MG/5ML syrup Take 10 mLs by mouth every 6 (six) hours as needed (cough and congestion). 10/15/23  Yes Iola Lukes, FNP  Dextromethorphan-guaiFENesin  (MUCINEX  DM MAXIMUM STRENGTH) 60-1200 MG TB12 Take 1 tablet by mouth 2 (two) times daily. 10/15/23  Yes Iola Lukes, FNP  fluticasone  (FLONASE ) 50 MCG/ACT nasal spray Place 2 sprays into both nostrils daily. Shake well before use. Gently blow nose before spraying. Do not blow nose immediately after use. You should not taste the medication or feel it going down your throat; if you do, adjust your technique. 10/15/23  Yes  Dewan Emond, FNP  ORILISSA 200 MG TABS Take 1 tablet by mouth daily.    [provider]    Family History Family History  Problem Relation Age of Onset   Cancer Mother    Asthma Mother    Miscarriages / [place] Mother    Stroke Mother    Hypertension Mother    Migraines Mother    Seizures Mother    Anxiety disorder Mother    Asthma Sister    Miscarriages / [place] Sister    Migraines Sister    Seizures Sister    Anxiety disorder Sister    ADD / ADHD Sister    Depression Sister    Bipolar disorder Sister    Hypertension Maternal Grandmother    Diabetes Maternal Grandmother    Migraines Maternal Grandmother    Depression Maternal Grandmother    Kidney disease Maternal Grandfather    Hypertension Maternal Grandfather    Migraines Maternal Grandfather    Depression Maternal Grandfather    Autism Neg Hx    Schizophrenia Neg Hx     Social History Social History   Tobacco Use   Smoking status: Former    Current packs/day: 0.00    Types: Cigarettes    Start date: 08/2016    Quit date: 2019    Years since quitting: 6.6   Smokeless tobacco: Never  Vaping Use   Vaping status: Some Days   Substances: Nicotine, Flavoring   Devices: lost mary  Substance Use Topics   Alcohol use: Yes    Comment: occasional   Drug use: Never     Allergies   Amoxicillin and Latex   Review of Systems Review of Systems  Constitutional:  Positive for chills and fever (subjective).  HENT:  Positive for congestion, postnasal drip, rhinorrhea, sinus pressure, sore throat and voice change.   Respiratory:  Positive for cough. Negative for shortness of breath and wheezing.   Gastrointestinal:  Positive for nausea. Negative for diarrhea and vomiting.  Musculoskeletal:  Positive for myalgias.  Neurological:  Positive for headaches.  All other systems reviewed and are negative.    Physical Exam Triage Vital Signs ED Triage Vitals  Encounter Vitals Group     BP  10/15/23 0850 112/81     Girls Systolic BP Percentile --      Girls Diastolic BP Percentile --      Boys Systolic BP Percentile --      Boys Diastolic BP Percentile --      Pulse Rate 10/15/23 0850 92     Resp 10/15/23 0850 20     Temp 10/15/23 0850 98.5 F (36.9 C)     Temp Source 10/15/23 0850 Oral     SpO2 10/15/23 0850 94 %     Weight --      Height --      Head Circumference --      Peak Flow --  Pain Score 10/15/23 0851 0     Pain Loc --      Pain Education --      Exclude from Growth Chart --    No data found.  Updated Vital Signs BP 112/81 (BP Location: Right Arm)   Pulse 92   Temp 98.5 F (36.9 C) (Oral)   Resp 20   SpO2 94%   Breastfeeding No   Visual Acuity Right Eye Distance:   Left Eye Distance:   Bilateral Distance:    Right Eye Near:   Left Eye Near:    Bilateral Near:     Physical Exam Vitals reviewed.  Constitutional:      General: She is awake. She is not in acute distress.    Appearance: Normal appearance. She is well-developed. She is not ill-appearing, toxic-appearing or diaphoretic.  HENT:     Head: Normocephalic.     Right Ear: Tympanic membrane, ear canal and external ear normal. No drainage, swelling or tenderness. No middle ear effusion. Tympanic membrane is not erythematous.     Left Ear: Tympanic membrane, ear canal and external ear normal. No drainage, swelling or tenderness.  No middle ear effusion. Tympanic membrane is not erythematous.     Nose: Congestion present. No rhinorrhea.     Mouth/Throat:     Lips: Pink.     Mouth: Mucous membranes are moist.     Pharynx: No pharyngeal swelling, oropharyngeal exudate, posterior oropharyngeal erythema or uvula swelling.     Tonsils: No tonsillar exudate or tonsillar abscesses.  Eyes:     General: Vision grossly intact.     Conjunctiva/sclera: Conjunctivae normal.  Cardiovascular:     Rate and Rhythm: Normal rate.     Heart sounds: Normal heart sounds.  Pulmonary:     Effort:  Pulmonary effort is normal. No tachypnea or respiratory distress.     Breath sounds: Normal breath sounds and air entry.  Musculoskeletal:        General: Normal range of motion.     Cervical back: Normal range of motion and neck supple.  Lymphadenopathy:     Cervical: No cervical adenopathy.  Skin:    General: Skin is warm and dry.  Neurological:     General: No focal deficit present.     Mental Status: She is alert and oriented to person, place, and time.  Psychiatric:        Behavior: Behavior is cooperative.      UC Treatments / Results  Labs (all labs ordered are listed, but only abnormal results are displayed) Labs Reviewed  POC SOFIA SARS ANTIGEN FIA - Abnormal; Notable for the following components:      Result Value   SARS Coronavirus 2 Ag Positive (*)    All other components within normal limits    EKG   Radiology No results found.  Procedures Procedures (including critical care time)  Medications Ordered in UC Medications - No data to display  Initial Impression / Assessment and Plan / UC Course  I have reviewed the triage vital signs and the nursing notes.  Pertinent labs & imaging results that were available during my care of the patient were reviewed by me and considered in my medical decision making (see chart for details).     The patient presents with symptoms of an acute respiratory illness and tested positive for COVID-19. The patient should maintain hydration and get adequate rest. Education was provided regarding the current understanding of COVID-19, which has generally become  less severe compared to earlier stages of the pandemic due to increased immunity and viral evolution. The patient was counseled on updated CDC guidance, which no longer requires a strict five-day isolation period. Instead, return to normal activities is appropriate once symptoms are improving overall and the patient has been fever-free for at least 24 hours without  fever-reducing medications. Close monitoring of symptoms over the next several days was advised, with follow-up with primary care if symptoms are not improving within a week. The patient should seek emergency care for shortness of breath, chest pain, dizziness, fainting, confusion, severe weakness, or inability to keep fluids down.  Today's evaluation has revealed no signs of a dangerous process. Discussed diagnosis with patient and/or guardian. Patient and/or guardian aware of their diagnosis, possible red flag symptoms to watch out for and need for close follow up. Patient and/or guardian understands verbal and written discharge instructions. Patient and/or guardian comfortable with plan and disposition.  Patient and/or guardian has a clear mental status at this time, good insight into illness (after discussion and teaching) and has clear judgment to make decisions regarding their care  Documentation was completed with the aid of voice recognition software. Transcription may contain typographical errors. Final Clinical Impressions(s) / UC Diagnoses   Final diagnoses:  COVID-19     Discharge Instructions      You have tested positive for COVID.  COVID is a viral infection.  Antibiotic medications are not prescribed for viral infections.  This is because antibiotics are designed to kill bacteria.  They do not kill viruses.  Take medications as prescribed.  Drink plenty of fluids and get lots of rest.  COVID-19 generally appears to be less severe now compared to earlier stages of the pandemic back in 2020. This is likely due to a combination of factors, including increased population immunity from vaccination and prior infections, as well as the evolution of the virus towards less virulent strains. While new variants continue to emerge, they generally cause milder illness, especially in individuals with prior immunity.  Because of this, the CDC has updated its isolation guidance for COVID-19 and states  that a 5-day isolation period following a positive test result is no longer needed. Under the new guidelines, people will not need to isolate if they are fever-free for at least 24 hours without medication and if their symptoms are mild and improving. You may return to normal activities if your symptoms are overall improving and you have been without a fever for 24 hours without taking fever reducing medications like Tylenol  or ibuprofen . Monitor your symptoms closely over the next several days. If your symptoms are not improving within a week, follow up with your primary care provider.  Go to the emergency department right away if you develop shortness of breath, chest pain, dizziness, fainting, confusion, severe weakness, or if you are unable to keep fluids down, as these may be signs of more serious illness.     ED Prescriptions     Medication Sig Dispense Auth. Provider   brompheniramine-pseudoephedrine-DM 30-2-10 MG/5ML syrup Take 10 mLs by mouth every 6 (six) hours as needed (cough and congestion). 120 mL Iola Lukes, FNP   Dextromethorphan-guaiFENesin  (MUCINEX  DM MAXIMUM STRENGTH) 60-1200 MG TB12 Take 1 tablet by mouth 2 (two) times daily. 20 tablet Iola Lukes, FNP   fluticasone  (FLONASE ) 50 MCG/ACT nasal spray Place 2 sprays into both nostrils daily. Shake well before use. Gently blow nose before spraying. Do not blow nose immediately after use. You should  not taste the medication or feel it going down your throat; if you do, adjust your technique. 16 g Iola Lukes, FNP      PDMP not reviewed this encounter.   Iola [place], [place] 10/15/23 [phone]

## 2023-10-15 NOTE — Discharge Instructions (Signed)
 You have tested positive for COVID.  COVID is a viral infection.  Antibiotic medications are not prescribed for viral infections.  This is because antibiotics are designed to kill bacteria.  They do not kill viruses.  Take medications as prescribed.  Drink plenty of fluids and get lots of rest.  COVID-19 generally appears to be less severe now compared to earlier stages of the pandemic back in 2020. This is likely due to a combination of factors, including increased population immunity from vaccination and prior infections, as well as the evolution of the virus towards less virulent strains. While new variants continue to emerge, they generally cause milder illness, especially in individuals with prior immunity.  Because of this, the CDC has updated its isolation guidance for COVID-19 and states that a 5-day isolation period following a positive test result is no longer needed. Under the new guidelines, people will not need to isolate if they are fever-free for at least 24 hours without medication and if their symptoms are mild and improving. You may return to normal activities if your symptoms are overall improving and you have been without a fever for 24 hours without taking fever reducing medications like Tylenol or ibuprofen. Monitor your symptoms closely over the next several days. If your symptoms are not improving within a week, follow up with your primary care provider.  Go to the emergency department right away if you develop shortness of breath, chest pain, dizziness, fainting, confusion, severe weakness, or if you are unable to keep fluids down, as these may be signs of more serious illness.

## 2024-01-23 ENCOUNTER — Encounter (HOSPITAL_COMMUNITY): Payer: Self-pay | Admitting: Student

## 2024-01-23 NOTE — Progress Notes (Signed)
 Spoke w/ via phone for pre-op interview--- Chelsea [name] needs dos----  CBC, BMP and T&S per careers adviser. [name] appt 01/30/24 at 1130. UPT day of surgery.       [name] results------ COVID test -----patient states asymptomatic no test needed Arrive at -------0530 NPO after MN NO Solid Food.   Pre-Surgery Ensure or G2:  Med rec completed Medications to take morning of surgery -----NONE Diabetic medication -----  GLP1 agonist last dose: GLP1 instructions:  Patient instructed no nail polish to be worn day of surgery Patient instructed to bring photo id and insurance card day of surgery Patient aware to have Driver (ride ) / caregiver    for 24 hours after surgery - Mother [name] Patient Special Instructions -----CHG shower night before surgery. Pre-Op special Instructions -----  Patient verbalized understanding of instructions that were given at this phone interview. Patient denies chest pain, sob, fever, cough at the interview.

## 2024-01-23 NOTE — Progress Notes (Signed)
 Surgical Instructions  Your procedure is scheduled on :   Report to [facility] Main Entrance A at 5:30 AM, then check in the Admitting office. Any questions or running late day of surgery :  call [phone]  Questions prior to your surgery day:  call [phone], Monday -- Friday 8am - 4pm. If you experience any cold or flu symptoms such as cough, fever, chills, shortness of breath, etc. between now and you scheduled surgery, please notify your surgeon office.   Remember: Do Not eat any food after midnight the night before surgery.    This includes No water,  candy,  gum, and mints.  Take these medicines the morning of surgery with A SIPS OF WATER:  NONE   May take these medicines IF NEEDED:  NONE   One week prior to surgery, STOP taking any Aspirin (unless otherwise instructed by your surgeon) Aleve, Naproxen, ibuprofen , Motrin , Advil , Goody's, BC's, all herbal medications/ supplements, fish oil, and non-prescription vitamins.  Do NOT Smoke (tobacco/ vaping) and Do Not drink alcohol for 24 hours prior to your procedure.  For those patients that use a CPAP.  Please bring your CPAP/ mask/ tubing with them day of surgery . Anesthesia may ask recovery room nurse to use and if you stay the night you be asked to use it.  You will be asked to removed any contacts, glasses, piercing's, hearing aid's, dentures/ partials prior to surgery.  Please bring cases/ container/ solution/ etc., for them day of surgery.   Patients discharged the day of surgery will NOT be allowed to drive home.  You must have responsible driver and caregiver to stay at home with you the next 24 hours.  SURGICAL WAITING ROOM VISITATION Patients may have no more than 2 support people in the waiting area - if more than 2 , these visitors may rotate.  Pre-op nurse will coordinate an appropriate time for 1 Adult support person, who may not rotate, to accompany patient in pre-op.  Aware some patients may have certain  circumstances, speak to pre-op nurse day of surgery.  Children under the age 67 must have an adult with them who is not the patient and must remain in the main waiting area with an adult.  If the patient needs to stay at the hospital during part of their recovery, the visitor guidelines for inpatient rooms apply.  Please refer to the [facility] website for the visitor guidelines for any additional information.  If you received a COVID test during your pre-op visit it is requested that you wear a mask when out in public, stay away from anyone that may not be feeling well and notify your surgeon if you develop symptoms.  If you have been in contact with anyone that has tested positive in the past 10 days notify your surgeon.     [place] - Preparing for Surgery  Before surgery, you can play an important role. Because skin is not sterile, it needs to be as free of germs as possible. You can reduce the number of germs on your skin by washing with CHG (chlorhexidine  gluconate) soap before surgery. CHG is an antiseptic cleaner which kills germs and bonds with the skin to continue killing germs even after washing. Oral hygiene is also important in reducing the risk of infection. Remember to brush your teeth with your regular toothpaste the morning of surgery.  Please DO NOT use if you have an allergy to CHG or antibacterial soaps. If your skin becomes  reddened/irritated stop using the CHG and inform your Pre-op nurse day of surgery.  DO NOT shave (including legs and genital area) for at least 48 hours prior to your CHG shower.   Please follow these instructions carefully:  Shower with CHG soap the night before surgery. If you choose to wash your hair, wash your hair first as usual with your normal shampoo. After you shampoo, rinse your hair and body thoroughly to remove the shampoo. Use CHG as you would any other liquid soap. You can apply CHG directly to the skin and wash gently with a clean  washcloth or shower sponge. Apply the CHG soap to your body ONLY FROM THE NECK DOWN. Do not use on open wounds or open sores. Avoid contact with your eyes, ears, mouth, and genitals (private parts). Wash genitals (private parts) with your normal soap. Wash thoroughly, paying special attention to the area where your surgery will be performed. Thoroughly rinse your body with warm water from the neck down. DO NOT shower/wash with your normal soap after using and rinsing off the CHG soap. DO NOT use lotions, oils, etc., after showering with CHG. Pat yourself dry with a clean towel. Wear clean pajamas. Place clean sheets on your bed the night of your CHG shower and do not sleep with pets.  Day of Surgery  DO NOT Apply any lotions,  powder,  oils,  deodorants (may use underarm deodorant),  cologne/  perfumes  or makeup Do Not wear jewelry /  piercing's/  metal/  permanent jewelry must be removed prior to arrival day of surgery. (No plastic piercing) Do Not wear nail polish,  gel polish,  artificial nails, or any other type of covering on natural finger nails (toe nails are okay) Remember to brush your teeth and rinse mouth out. Put on clean / comfortable clothes. [facility] is not responsible for valuables/ personal belongings

## 2024-01-25 ENCOUNTER — Other Ambulatory Visit (HOSPITAL_COMMUNITY)

## 2024-01-25 ENCOUNTER — Encounter (HOSPITAL_COMMUNITY)
Admission: RE | Admit: 2024-01-25 | Discharge: 2024-01-25 | Disposition: A | Discharge status: Home / Self Care | Source: Ambulatory Visit | Attending: Student | Admitting: Student

## 2024-01-25 DIAGNOSIS — G8929 Other chronic pain: Secondary | ICD-10-CM | POA: Insufficient documentation

## 2024-01-25 DIAGNOSIS — Z01812 Encounter for preprocedural laboratory examination: Secondary | ICD-10-CM | POA: Diagnosis present

## 2024-01-25 DIAGNOSIS — R102 Pelvic and perineal pain unspecified side: Secondary | ICD-10-CM | POA: Diagnosis not present

## 2024-01-25 LAB — CBC
HCT: 40.7 % (ref 36.0–46.0)
Hemoglobin: 14.7 g/dL (ref 12.0–15.0)
MCH: 32.1 pg (ref 26.0–34.0)
MCHC: 36.1 g/dL — ABNORMAL HIGH (ref 30.0–36.0)
MCV: 88.9 fL (ref 80.0–100.0)
Platelets: 260 K/uL (ref 150–400)
RBC: 4.58 MIL/uL (ref 3.87–5.11)
RDW: 11.8 % (ref 11.5–15.5)
WBC: 5.5 K/uL (ref 4.0–10.5)
nRBC: 0 % (ref 0.0–0.2)

## 2024-01-25 LAB — TYPE AND SCREEN
ABO/RH(D): AB NEG
Antibody Screen: NEGATIVE

## 2024-01-25 LAB — BASIC METABOLIC PANEL WITH GFR
Anion gap: 6 (ref 5–15)
BUN: 11 mg/dL (ref 6–20)
CO2: 31 mmol/L (ref 22–32)
Calcium: 9.6 mg/dL (ref 8.9–10.3)
Chloride: 103 mmol/L (ref 98–111)
Creatinine, Ser: 0.79 mg/dL (ref 0.44–1.00)
GFR, Estimated: >60 mL/min (ref >60)
Glucose, Bld: 84 mg/dL (ref 70–99)
Potassium: 4.4 mmol/L (ref 3.5–5.1)
Sodium: 140 mmol/L (ref 135–145)

## 2024-01-28 ENCOUNTER — Other Ambulatory Visit: Payer: Self-pay

## 2024-01-28 ENCOUNTER — Encounter (HOSPITAL_COMMUNITY): Payer: Self-pay | Admitting: Emergency Medicine

## 2024-01-28 ENCOUNTER — Emergency Department (HOSPITAL_COMMUNITY)
Admission: EM | Admit: 2024-01-28 | Discharge: 2024-01-28 | Disposition: A | Discharge status: Home / Self Care | Attending: Emergency Medicine | Admitting: Emergency Medicine

## 2024-01-28 DIAGNOSIS — Z91040 Latex allergy status: Secondary | ICD-10-CM | POA: Insufficient documentation

## 2024-01-28 DIAGNOSIS — S4492XA Injury of unspecified nerve at shoulder and upper arm level, left arm, initial encounter: Secondary | ICD-10-CM | POA: Insufficient documentation

## 2024-01-28 DIAGNOSIS — R202 Paresthesia of skin: Secondary | ICD-10-CM | POA: Insufficient documentation

## 2024-01-28 DIAGNOSIS — X58XXXA Exposure to other specified factors, initial encounter: Secondary | ICD-10-CM | POA: Insufficient documentation

## 2024-01-28 DIAGNOSIS — M79642 Pain in left hand: Secondary | ICD-10-CM | POA: Diagnosis present

## 2024-01-28 NOTE — ED Provider Notes (Signed)
 " [place] EMERGENCY DEPARTMENT AT [facility] Provider Note   CSN: [id number] Arrival date & time: 01/28/24  1519     Patient presents with: Arm Injury (left)   Chelsea [name] is a 23 y.o. female.   23 year old female presents emergency department paresthesias.  Patient reports that on Thursday she had an IV placed in her left arm.  During the procedure she felt severe pain that radiated down her left hand and since then has been having pins and needle sensation that shoots from her elbow to her 3rd and 4th fingertips.  No weakness.  No swelling of her arm.       Prior to Admission medications  Medication Sig Start Date End Date Taking? Authorizing Provider  brompheniramine-pseudoephedrine-DM 30-2-10 MG/5ML syrup Take 10 mLs by mouth every 6 (six) hours as needed (cough and congestion). Patient not taking: Reported on 01/23/2024 10/15/23   Iola Lukes, FNP  Dextromethorphan-guaiFENesin  (MUCINEX  DM MAXIMUM STRENGTH) 60-1200 MG TB12 Take 1 tablet by mouth 2 (two) times daily. Patient not taking: Reported on 01/23/2024 10/15/23   Iola Lukes, FNP  ferrous sulfate 324 MG TBEC Take 324 mg by mouth. Monday Wednesday and Friday    [provider]  fluticasone  (FLONASE ) 50 MCG/ACT nasal spray Place 2 sprays into both nostrils daily. Shake well before use. Gently blow nose before spraying. Do not blow nose immediately after use. You should not taste the medication or feel it going down your throat; if you do, adjust your technique. Patient not taking: Reported on 01/23/2024 10/15/23   Iola Lukes, FNP  ORILISSA 200 MG TABS Take 1 tablet by mouth daily.    [provider]    Allergies: Amoxicillin and Latex    Review of Systems  Updated Vital Signs BP (!) 139/91 (BP Location: Right Arm)   Pulse 86   Temp 97.9 F (36.6 C)   Resp 17   Ht 5' 2 (1.575 m)   Wt 46.7 kg   LMP  (LMP Unknown)   SpO2 100%   BMI 18.84 kg/m   Physical  Exam Musculoskeletal:     Comments: Venipuncture access site in left [facility] appears clean dry and intact.  Does have a small hematoma.  Compartments of the forearm soft.  Symmetrically palpable 2+ radial and ulnar pulses. Capillary refill <2 seconds to all digits.  Intact sensation to light touch of the radial, median and ulnar nerves demonstrated by testing in the dorsal web space of the thumb, the hypothenar eminence of the palm, and the radial aspect of the dorsum of the hand.  Intact motor function of the radial, median and ulnar nerves demonstrated by strength of hand grip, and spreading of the 2nd through 5th digits, thumb apposition, and ability to make OK sign.     (all labs ordered are listed, but only abnormal results are displayed) Labs Reviewed - No data to display  EKG: None  Radiology: No results found.   Procedures  EMERGENCY DEPARTMENT US  EXTREMITY EXAM Study:  Limited Duplex of Upper Extremity Veins  INDICATIONS: arm pain recent IV Visualization of  regions in transverse plane with full compression visualized.   PERFORMED BY: Myself IMAGES ARCHIVED?: No VIEWS USED: cephalic, basilic, brachial vein INTERPRETATION: No DVT visualized      Medications Ordered in the ED - No data to display  Medical Decision Making  Chelsea [name] is a 23 year old female presents to the emergency department with left hand pain  Initial Ddx:  Thrombophlebitis, peripheral nerve injury, compartment syndrome, hematoma  MDM/Course:  Patient presents emergency department with paresthesias running down her left hand after an IV attempt several days ago.  On exam appears to be neurovascular intact.  Does have a small hematoma in the area.  No signs of compartment syndrome.  Had a bedside ultrasound that did not show any evidence of thrombus.  Suspect that she likely has a peripheral nerve injury due to the IV attempts which should improve  spontaneously.  However follow-up with hand if her symptoms persist.  Counseled on supportive care for this at home  This patient presents to the ED for concern of complaints listed in HPI, this involves an extensive number of treatment options, and is a complaint that carries with it a high risk of complications and morbidity. Disposition including potential need for admission considered.   Dispo: DC Home. Return precautions discussed including, but not limited to, those listed in the AVS. Allowed pt time to ask questions which were answered fully prior to dc.  I have reviewed the patients home medications and made adjustments as needed Records reviewed Outpatient Clinic Notes  Portions of this note were generated with Dragon dictation software. Dictation errors may occur despite best attempts at proofreading.     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

## 2024-01-28 NOTE — Discharge Instructions (Signed)
 You were seen for your nerve injury in the emergency department.   At home, please take Tylenol  and ibuprofen  for the pain.  Use warm compresses.  This will likely improve with time  Check your MyChart online for the results of any tests that had not resulted by the time you left the emergency department.   Follow-up with hand surgeon in 1 to 2 weeks.  Return immediately to the emergency department if you experience any of the following: Changes in color of your hand, coolness of your hand, weakness of your hand, or any other concerning symptoms.    Thank you for visiting our Emergency Department. It was a pleasure taking care of you today.

## 2024-01-28 NOTE — ED Triage Notes (Signed)
 Pov c/o left arm pain r/t a blood draw pt had last Thursday for pre-op. Pt states has had shooting pain, numbness, tingling, troubles gripping, and cold sensations since. Pain is from the elbow down. NAD

## 2024-01-30 ENCOUNTER — Inpatient Hospital Stay (HOSPITAL_COMMUNITY): Admission: RE | Admit: 2024-01-30 | Source: Ambulatory Visit

## 2024-02-03 ENCOUNTER — Encounter (HOSPITAL_COMMUNITY): Payer: Self-pay | Admitting: Emergency Medicine

## 2024-02-03 ENCOUNTER — Emergency Department (HOSPITAL_COMMUNITY)
Admission: EM | Admit: 2024-02-03 | Discharge: 2024-02-03 | Disposition: A | Discharge status: Home / Self Care | Attending: Emergency Medicine | Admitting: Emergency Medicine

## 2024-02-03 ENCOUNTER — Other Ambulatory Visit: Payer: Self-pay

## 2024-02-03 DIAGNOSIS — B338 Other specified viral diseases: Secondary | ICD-10-CM | POA: Insufficient documentation

## 2024-02-03 DIAGNOSIS — Z91040 Latex allergy status: Secondary | ICD-10-CM | POA: Diagnosis not present

## 2024-02-03 DIAGNOSIS — B974 Respiratory syncytial virus as the cause of diseases classified elsewhere: Secondary | ICD-10-CM | POA: Diagnosis not present

## 2024-02-03 DIAGNOSIS — R112 Nausea with vomiting, unspecified: Secondary | ICD-10-CM | POA: Diagnosis present

## 2024-02-03 LAB — RESP PANEL BY RT-PCR (RSV, FLU A&B, COVID)  RVPGX2
Influenza A by PCR: NEGATIVE
Influenza B by PCR: NEGATIVE
Resp Syncytial Virus by PCR: POSITIVE — AB
SARS Coronavirus 2 by RT PCR: NEGATIVE

## 2024-02-03 NOTE — Discharge Instructions (Signed)
 You are seen for body aches, chills.  Your respiratory swab was positive for the RSV virus.  You can take Tylenol  and ibuprofen  as needed for discomfort, drink plenty of fluids, rest.  Make sure you call your OB/GYN Monday morning to let them know that you have this virus to see if they want to postpone your surgery.

## 2024-02-03 NOTE — ED Triage Notes (Signed)
 Pov c/o vomiting, diarrhea, chills, body aches, headache that started dec 25. Unknown of any fevers. Family member has same symptoms in household. Tylenol  last took 0900.

## 2024-02-03 NOTE — ED Provider Notes (Signed)
 " [place] EMERGENCY DEPARTMENT AT [facility] Provider Note   CSN: [id number] Arrival date & time: 02/03/24  8166     Patient presents with: Flu Like Symptoms   Chelsea [name] is a 23 y.o. female.  Presents ER today for nausea and vomiting that started on Christmas which is now resolved, now she just has fatigue, body aches and chills.  She denies a fever.  Her son has similar symptoms.  She is concerned because she has surgery scheduled on December 31 for a hysterectomy and wants to know if she is still able to go ahead and have the surgery.   HPI     Prior to Admission medications  Medication Sig Start Date End Date Taking? Authorizing Provider  brompheniramine-pseudoephedrine-DM 30-2-10 MG/5ML syrup Take 10 mLs by mouth every 6 (six) hours as needed (cough and congestion). Patient not taking: Reported on 01/23/2024 10/15/23   Iola Lukes, FNP  Dextromethorphan-guaiFENesin  (MUCINEX  DM MAXIMUM STRENGTH) 60-1200 MG TB12 Take 1 tablet by mouth 2 (two) times daily. Patient not taking: Reported on 01/23/2024 10/15/23   Iola Lukes, FNP  ferrous sulfate 324 MG TBEC Take 324 mg by mouth. Monday Wednesday and Friday    [provider]  fluticasone  (FLONASE ) 50 MCG/ACT nasal spray Place 2 sprays into both nostrils daily. Shake well before use. Gently blow nose before spraying. Do not blow nose immediately after use. You should not taste the medication or feel it going down your throat; if you do, adjust your technique. Patient not taking: Reported on 01/23/2024 10/15/23   Iola Lukes, FNP  ORILISSA 200 MG TABS Take 1 tablet by mouth daily.    [provider]    Allergies: Amoxicillin and Latex    Review of Systems  Updated Vital Signs BP 111/74 (BP Location: Right Arm)   Pulse 77   Temp 98.4 F (36.9 C) (Oral)   Resp 16   Ht 5' 2 (1.575 m)   Wt 46.7 kg   LMP  (LMP Unknown)   SpO2 99%   BMI 18.84 kg/m   Physical Exam Vitals and nursing  note reviewed.  Constitutional:      General: She is not in acute distress.    Appearance: She is well-developed.  HENT:     Head: Normocephalic and atraumatic.     Mouth/Throat:     Mouth: Mucous membranes are moist.  Eyes:     Conjunctiva/sclera: Conjunctivae normal.  Cardiovascular:     Rate and Rhythm: Normal rate and regular rhythm.     Heart sounds: No murmur heard. Pulmonary:     Effort: Pulmonary effort is normal. No respiratory distress.     Breath sounds: Normal breath sounds.  Abdominal:     Palpations: Abdomen is soft.     Tenderness: There is no abdominal tenderness.  Musculoskeletal:        General: No swelling.     Cervical back: Neck supple.  Skin:    General: Skin is warm and dry.     Capillary Refill: Capillary refill takes less than 2 seconds.  Neurological:     General: No focal deficit present.     Mental Status: She is alert and oriented to person, place, and time.  Psychiatric:        Mood and Affect: Mood normal.     (all labs ordered are listed, but only abnormal results are displayed) Labs Reviewed  RESP PANEL BY RT-PCR (RSV, FLU A&B, COVID)  RVPGX2 - Abnormal;  Notable for the following components:      Result Value   Resp Syncytial Virus by PCR POSITIVE (*)    All other components within normal limits    EKG: None  Radiology: No results found.   Procedures   Medications Ordered in the ED - No data to display                                  Medical Decision Making Differential diagnosis includes but not limited to influenza, COVID-19, RSV, viral illness, gastroenteritis, other  ED course: Patient has primarily fatigue, body aches and chills, send has similar symptoms with runny nose and cough was positive for RSV here in the ED today, patient is also positive for RSV.  She initially had some nausea and vomiting and Christmas which has not resolved.  She is eating and drinking well she has normal vitals.  Advised on supportive care  at home and letting her gynecologist know that she tested positive for RSV to determine if she still able to have surgery as scheduled.   Well-appearing 23 year old female, presents ER today complaining of aches, chills, headache previously had some nausea and vomiting which have resolved.  Afebrile here with normal vitals, primarily concerned due to upcoming surgery.  She has a reassuring exam, reassuring vitals.  Do not feel she needs any further testing aside from respiratory panel which was positive for RSV.  Discussed with her that she needs to inform her surgeon that she has RSV virus to see if they want to postpone surgery.     Final diagnoses:  None    ED Discharge Orders     None          Suellen Sherran DELENA DEVONNA 02/03/24 2029    Francesca Elsie CROME, MD 02/03/24 2205  "

## 2024-02-03 NOTE — ED Notes (Signed)
 Pt completely assessed by EDP and set to discharge. No RN assessment performed.  Pt provided discharge instructions and prescription information. Pt was given the opportunity to ask questions and questions were answered.

## 2024-02-07 ENCOUNTER — Encounter: Admission: RE | Payer: Self-pay

## 2024-02-07 ENCOUNTER — Ambulatory Visit (HOSPITAL_COMMUNITY): Admission: RE | Admit: 2024-02-07 | Admitting: Student

## 2024-02-07 DIAGNOSIS — G8929 Other chronic pain: Secondary | ICD-10-CM

## 2024-02-07 DIAGNOSIS — N8003 Adenomyosis of the uterus: Secondary | ICD-10-CM

## 2024-02-07 SURGERY — HYSTERECTOMY, TOTAL, LAPAROSCOPIC, ROBOT-ASSISTED WITH SALPINGECTOMY
Anesthesia: General

## 2024-04-08 ENCOUNTER — Ambulatory Visit (HOSPITAL_COMMUNITY): Admit: 2024-04-08 | Admitting: Student

## 2024-04-08 DIAGNOSIS — N8003 Adenomyosis of the uterus: Secondary | ICD-10-CM

## 2024-04-08 DIAGNOSIS — G8929 Other chronic pain: Secondary | ICD-10-CM

## 2024-04-08 SURGERY — HYSTERECTOMY, TOTAL, LAPAROSCOPIC, ROBOT-ASSISTED WITH SALPINGECTOMY
Anesthesia: General
